# Patient Record
Sex: Female | Born: 1980 | Race: White | Hispanic: No | Marital: Single | Smoking: Never smoker
Health system: Southern US, Community
[De-identification: ages and names within clinical notes are randomized; demographics above are authoritative.]

## PROBLEM LIST (undated history)

## (undated) DIAGNOSIS — E282 Polycystic ovarian syndrome: Secondary | ICD-10-CM

## (undated) DIAGNOSIS — I10 Essential (primary) hypertension: Secondary | ICD-10-CM

## (undated) DIAGNOSIS — A6000 Herpesviral infection of urogenital system, unspecified: Secondary | ICD-10-CM

## (undated) HISTORY — PX: CHOLECYSTECTOMY: SHX55

---

## 2004-08-13 ENCOUNTER — Observation Stay: Payer: Self-pay | Admitting: Obstetrics and Gynecology

## 2004-10-07 ENCOUNTER — Emergency Department: Payer: Self-pay | Admitting: Emergency Medicine

## 2004-11-24 ENCOUNTER — Emergency Department: Payer: Self-pay | Admitting: Emergency Medicine

## 2005-04-03 ENCOUNTER — Emergency Department: Payer: Self-pay | Admitting: Emergency Medicine

## 2005-04-06 ENCOUNTER — Emergency Department: Payer: Self-pay | Admitting: Unknown Physician Specialty

## 2005-07-16 ENCOUNTER — Emergency Department: Payer: Self-pay | Admitting: Emergency Medicine

## 2006-02-14 ENCOUNTER — Emergency Department: Payer: Self-pay | Admitting: Emergency Medicine

## 2006-02-24 ENCOUNTER — Emergency Department: Payer: Self-pay | Admitting: Emergency Medicine

## 2007-01-30 ENCOUNTER — Emergency Department: Payer: Self-pay | Admitting: Emergency Medicine

## 2007-02-10 ENCOUNTER — Ambulatory Visit: Payer: Self-pay | Admitting: General Surgery

## 2009-09-19 ENCOUNTER — Emergency Department: Payer: Self-pay | Admitting: Emergency Medicine

## 2011-10-20 ENCOUNTER — Emergency Department: Payer: Self-pay | Admitting: *Deleted

## 2012-08-02 ENCOUNTER — Emergency Department: Payer: Self-pay | Admitting: Internal Medicine

## 2013-07-31 ENCOUNTER — Emergency Department: Payer: Self-pay | Admitting: Emergency Medicine

## 2013-07-31 LAB — URINALYSIS, COMPLETE
BACTERIA: NONE SEEN
BLOOD: NEGATIVE
Bilirubin,UR: NEGATIVE
Glucose,UR: NEGATIVE mg/dL (ref 0–75)
KETONE: NEGATIVE
Nitrite: NEGATIVE
PROTEIN: NEGATIVE
Ph: 6 (ref 4.5–8.0)
RBC,UR: 2 /HPF (ref 0–5)
SPECIFIC GRAVITY: 1.02 (ref 1.003–1.030)

## 2013-07-31 LAB — WET PREP, GENITAL

## 2013-07-31 LAB — GC/CHLAMYDIA PROBE AMP

## 2014-04-08 ENCOUNTER — Emergency Department: Payer: Self-pay | Admitting: Emergency Medicine

## 2014-07-29 ENCOUNTER — Emergency Department: Payer: Self-pay

## 2014-07-29 ENCOUNTER — Emergency Department
Admission: EM | Admit: 2014-07-29 | Discharge: 2014-07-29 | Disposition: A | Discharge status: Home / Self Care | Payer: Self-pay | Attending: Emergency Medicine | Admitting: Emergency Medicine

## 2014-07-29 DIAGNOSIS — I10 Essential (primary) hypertension: Secondary | ICD-10-CM | POA: Insufficient documentation

## 2014-07-29 DIAGNOSIS — R2240 Localized swelling, mass and lump, unspecified lower limb: Secondary | ICD-10-CM | POA: Insufficient documentation

## 2014-07-29 HISTORY — DX: Essential (primary) hypertension: I10

## 2014-07-29 LAB — COMPREHENSIVE METABOLIC PANEL
ALBUMIN: 3.8 g/dL (ref 3.5–5.0)
ALT: 16 U/L (ref 14–54)
AST: 21 U/L (ref 15–41)
Alkaline Phosphatase: 70 U/L (ref 38–126)
Anion gap: 7 (ref 5–15)
BILIRUBIN TOTAL: 0.3 mg/dL (ref 0.3–1.2)
BUN: 10 mg/dL (ref 6–20)
CHLORIDE: 105 mmol/L (ref 101–111)
CO2: 26 mmol/L (ref 22–32)
Calcium: 9 mg/dL (ref 8.9–10.3)
Creatinine, Ser: 0.78 mg/dL (ref 0.44–1.00)
GFR calc Af Amer: >60 mL/min (ref >60)
Glucose, Bld: 94 mg/dL (ref 65–99)
Potassium: 4.1 mmol/L (ref 3.5–5.1)
Sodium: 138 mmol/L (ref 135–145)
Total Protein: 7.9 g/dL (ref 6.5–8.1)

## 2014-07-29 LAB — CBC
HCT: 36.2 % (ref 35.0–47.0)
Hemoglobin: 11.5 g/dL — ABNORMAL LOW (ref 12.0–16.0)
MCH: 26.2 pg (ref 26.0–34.0)
MCHC: 31.7 g/dL — AB (ref 32.0–36.0)
MCV: 82.6 fL (ref 80.0–100.0)
Platelets: 294 10*3/uL (ref 150–440)
RBC: 4.39 MIL/uL (ref 3.80–5.20)
RDW: 18.6 % — ABNORMAL HIGH (ref 11.5–14.5)
WBC: 10.5 10*3/uL (ref 3.6–11.0)

## 2014-07-29 LAB — TROPONIN I: Troponin I: <0.03 ng/mL (ref <0.031)

## 2014-07-29 NOTE — ED Notes (Signed)
Pt reports swelling to bilateral ankles and feet since Thursday with high blood pressure. Denies SOB or Chest pain

## 2014-07-29 NOTE — ED Notes (Signed)
Pt reports leg swelling since Thursday.  Reports painful to walk.  Pt denies any long car rides.

## 2014-07-29 NOTE — Discharge Instructions (Signed)

## 2014-07-29 NOTE — ED Provider Notes (Addendum)
[facility] Emergency Department Provider Note    Time seen: 11 AM.  I have reviewed the triage vital signs and the nursing notes.   HISTORY  Chief Complaint Hypertension and Leg Swelling    HPI Becky Chen is a 34 y.o. female who presents to the ER with leg swelling since Thursday. States is painful for her to walk. Says she feels like she has extra fluid on her and that she has recently had high blood pressure. Denies any other complaints, or history of same      Past Medical History  Diagnosis Date  . Hypertension     There are no active problems to display for this patient.   Past Surgical History  Procedure Laterality Date  . Cholecystectomy    . Cesarean section      No current outpatient prescriptions on file.  Allergies Review of patient's allergies indicates no known allergies.  No family history on file.  Social History History  Substance Use Topics  . Smoking status: Never Smoker   . Smokeless tobacco: Not on file  . Alcohol Use: No   Family Review of Systems Constitutional: Negative for fever. Eyes: Negative for visual changes. ENT: Negative for sore throat. Cardiovascular: Negative for chest pain. Respiratory: Negative for shortness of breath. Gastrointestinal: Negative for abdominal pain, vomiting and diarrhea. Genitourinary: Negative for dysuria. Musculoskeletal: Leg swelling Skin: Negative for rash. Neurological: Negative for headaches, focal weakness or numbness.  10-point ROS otherwise negative.  ____________________________________________   PHYSICAL EXAM:  VITAL SIGNS: ED Triage Vitals  Enc Vitals Group     BP 07/29/14 1103 138/78 mmHg     Pulse Rate 07/29/14 1103 72     Resp 07/29/14 1103 20     Temp 07/29/14 1103 97.7 F (36.5 C)     Temp Source 07/29/14 1103 Oral     SpO2 07/29/14 1103 100 %     Weight 07/29/14 1109 278 lb (126.1 kg)     Height 07/29/14 1109 5\' 10"  (1.778 m)     Head Cir  --      Peak Flow --      Pain Score 07/29/14 1110 8     Pain Loc --      Pain Edu? --      Excl. in GC? --     Constitutional: Alert and oriented. Well appearing and in no distress. Eyes: Conjunctivae are normal. PERRL. Normal extraocular movements. ENT   Head: Normocephalic and atraumatic.   Nose: No congestion/rhinnorhea.   Mouth/Throat: Mucous membranes are moist.   Neck: No stridor. Hematological/Lymphatic/Immunilogical: No cervical lymphadenopathy. Cardiovascular: Normal rate, regular rhythm. Normal and symmetric distal pulses are present in all extremities. No murmurs, rubs, or gallops. Respiratory: Normal respiratory effort without tachypnea nor retractions. Breath sounds are clear and equal bilaterally. No wheezes/rales/rhonchi. Gastrointestinal: Soft and nontender. No distention. No abdominal bruits. There is no CVA tenderness. Musculoskeletal: Nontender with normal range of motion in all extremities. No joint effusions.  No lower extremity tenderness nor edema, legs appear symmetric Neurologic:  Normal speech and language. No gross focal neurologic deficits are appreciated. Speech is normal. No gait instability. Skin:  Skin is warm, dry and intact. No rash noted. Psychiatric: Mood and affect are normal. Speech and behavior are normal. Patient exhibits appropriate insight and judgment.  ____________________________________________    LABS (pertinent positives/negatives)  Labs Reviewed  CBC - Abnormal; Notable for the following:    Hemoglobin 11.5 (*)    MCHC 31.7 (*)  RDW 18.6 (*)    All other components within normal limits  TROPONIN I  COMPREHENSIVE METABOLIC PANEL    EKG: Normal sinus rhythm with incomplete by a right bundle branch block, rate is 74, no evidence of hypertrophy or acute infarction. EKG interpreted by me ____________________________________________    RADIOLOGY  No acute  process  ____________________________________________    ED COURSE  Pertinent labs & imaging results that were available during my care of the patient were reviewed by me and considered in my medical decision making (see chart for details).  Check basic labs and reevaluate. No indication for ultrasound this time.  FINAL ASSESSMENT AND PLAN  Assessment: Hypertension   Plan: No acute emergency medical condition. Patient Is Advised Increase Physical Activity and Follow up with Her Primary Care Doctor.    Emily FilbertWilliams, Zimal Weisensel E, MD   Emily FilbertJonathan E Oceana Walthall, MD 07/29/14 1244  Emily FilbertJonathan E Zinia Innocent, MD 07/29/14 [id number]  Emily FilbertJonathan E Frimet Durfee, MD 08/14/14 0730  Emily FilbertJonathan E Rogers Ditter, MD 08/14/14 [phone]

## 2014-10-15 ENCOUNTER — Emergency Department
Admission: EM | Admit: 2014-10-15 | Discharge: 2014-10-15 | Disposition: A | Discharge status: Home / Self Care | Payer: Self-pay | Attending: Emergency Medicine | Admitting: Emergency Medicine

## 2014-10-15 ENCOUNTER — Encounter: Payer: Self-pay | Admitting: Emergency Medicine

## 2014-10-15 DIAGNOSIS — A6000 Herpesviral infection of urogenital system, unspecified: Secondary | ICD-10-CM

## 2014-10-15 DIAGNOSIS — I10 Essential (primary) hypertension: Secondary | ICD-10-CM | POA: Insufficient documentation

## 2014-10-15 DIAGNOSIS — A6009 Herpesviral infection of other urogenital tract: Secondary | ICD-10-CM | POA: Insufficient documentation

## 2014-10-15 HISTORY — DX: Herpesviral infection of urogenital system, unspecified: A60.00

## 2014-10-15 MED ORDER — ACYCLOVIR 400 MG PO TABS: 400.0000 mg | ORAL_TABLET | Freq: Three times a day (TID) | ORAL | Status: DC

## 2014-10-15 NOTE — ED Provider Notes (Signed)
[facility] Emergency Department Provider Note ____________________________________________  Time seen: 1130  I have reviewed the triage vital signs and the nursing notes.  HISTORY  Chief Complaint  Vaginal Itching  HPI Becky Chen is a 34 y.o. female reports to the ED for evaluation management of a herpes outbreak. She describes that she was initially diagnosed in 2014 with her initial outbreak. She denies any outbreaks in the interim. She has noted over the last 2 days a lesions to her vulvar area. She describes them and blisters similar to her initial outbreak, just not as painful. She denies any fevers, chills, sweats. She is also without any vaginal discharge or abnormal bleeding. She rates her pain at a 3 out of 10 in triage.  Past Medical History  Diagnosis Date  . Hypertension   . Herpes genitalis    There are no active problems to display for this patient.  Past Surgical History  Procedure Laterality Date  . Cholecystectomy    . Cesarean section     Current Outpatient Rx  Name  Route  Sig  Dispense  Refill  . acyclovir (ZOVIRAX) 400 MG tablet   Oral   Take 1 tablet (400 mg total) by mouth 3 (three) times daily.   35 tablet   0    Allergies Review of patient's allergies indicates no known allergies.  History reviewed. No pertinent family history.  Social History History  Substance Use Topics  . Smoking status: Never Smoker   . Smokeless tobacco: Not on file  . Alcohol Use: No   Review of Systems  Constitutional: Negative for fever. Eyes: Negative for visual changes. ENT: Negative for sore throat. Cardiovascular: Negative for chest pain. Respiratory: Negative for shortness of breath. Gastrointestinal: Negative for abdominal pain, vomiting and diarrhea. Genitourinary: Negative for dysuria. Musculoskeletal: Negative for back pain. Skin: Negative for rash. Herpes outbreak Neurological: Negative for headaches, focal weakness or  numbness. ____________________________________________  PHYSICAL EXAM:  VITAL SIGNS: ED Triage Vitals  Enc Vitals Group     BP --      Pulse --      Resp --      Temp --      Temp src --      SpO2 --      Weight 10/15/14 1128 242 lb (109.77 kg)     Height 10/15/14 1128 5\' 2"  (1.575 m)     Head Cir --      Peak Flow --      Pain Score 10/15/14 1126 3     Pain Loc --      Pain Edu? --      Excl. in GC? --    Constitutional: Alert and oriented. Well appearing and in no distress. HEENT: Normocephalic and atraumatic. Conjunctivae are normal. PERRL. Normal extraocular movements. Respiratory: Normal respiratory effort.  Musculoskeletal: Nontender with normal range of motion in all extremities.  Neurologic:  Normal gait without ataxia. Normal speech and language. No gross focal neurologic deficits are appreciated. Skin:  Skin is warm, dry and intact. External labia with grouped vesicles consistent with recurrent herpes  Psychiatric: Mood and affect are normal. Patient exhibits appropriate insight and judgment. ____________________________________________  INITIAL IMPRESSION / ASSESSMENT AND PLAN / ED COURSE  Treatment for recurrent herpes genitalis outbreak. Acyclovir 400 mg TIDx 5 days.  Follow-up with primary provider as needed.  ____________________________________________  FINAL CLINICAL IMPRESSION(S) / ED DIAGNOSES  Final diagnoses:  Herpes genitalis     Becky Chen V Lonia Skinner,  PA-C 10/15/14 1159  Phineas Semen, MD 10/15/14 1231

## 2014-10-15 NOTE — Discharge Instructions (Signed)
Herpes Simplex Herpes simplex is generally classified as Type 1 or Type 2. Type 1 is generally the type that is responsible for cold sores. Type 2 is generally associated with sexually transmitted diseases. We now know that most of the thoughts on these viruses are inaccurate. We find that HSV1 is also present genitally and HSV2 can be present orally, but this will vary in different locations of the world. Herpes simplex is usually detected by doing a culture. Blood tests are also available for this virus; however, the accuracy is often not as good.  PREPARATION FOR TEST No preparation or fasting is necessary. NORMAL FINDINGS  No virus present  No HSV antigens or antibodies present Ranges for normal findings may vary among different laboratories and hospitals. You should always check with your doctor after having lab work or other tests done to discuss the meaning of your test results and whether your values are considered within normal limits. MEANING OF TEST  Your caregiver will go over the test results with you and discuss the importance and meaning of your results, as well as treatment options and the need for additional tests if necessary. OBTAINING THE TEST RESULTS  It is your responsibility to obtain your test results. Ask the lab or department performing the test when and how you will get your results. Document Released: 04/10/2004 Document Revised: 05/31/2011 Document Reviewed: 02/17/2008 [facility] Patient Information 2015 [place], [place]. This information is not intended to replace advice given to you by your health care provider. Make sure you discuss any questions you have with your health care provider.  Take the antiviral as directed. Follow-up with your provider as needed.

## 2014-10-15 NOTE — ED Notes (Signed)
States she developed a herpes outbreak about 2 days ago

## 2015-09-06 ENCOUNTER — Encounter: Payer: Self-pay | Admitting: Emergency Medicine

## 2015-09-06 ENCOUNTER — Emergency Department: Payer: Medicaid Other

## 2015-09-06 ENCOUNTER — Emergency Department
Admission: EM | Admit: 2015-09-06 | Discharge: 2015-09-06 | Disposition: A | Discharge status: Home / Self Care | Payer: Medicaid Other | Attending: Emergency Medicine | Admitting: Emergency Medicine

## 2015-09-06 DIAGNOSIS — S300XXA Contusion of lower back and pelvis, initial encounter: Secondary | ICD-10-CM

## 2015-09-06 DIAGNOSIS — Z79899 Other long term (current) drug therapy: Secondary | ICD-10-CM | POA: Insufficient documentation

## 2015-09-06 DIAGNOSIS — Y929 Unspecified place or not applicable: Secondary | ICD-10-CM | POA: Insufficient documentation

## 2015-09-06 DIAGNOSIS — Y999 Unspecified external cause status: Secondary | ICD-10-CM | POA: Diagnosis not present

## 2015-09-06 DIAGNOSIS — I10 Essential (primary) hypertension: Secondary | ICD-10-CM | POA: Diagnosis not present

## 2015-09-06 DIAGNOSIS — M549 Dorsalgia, unspecified: Secondary | ICD-10-CM | POA: Diagnosis present

## 2015-09-06 DIAGNOSIS — W07XXXA Fall from chair, initial encounter: Secondary | ICD-10-CM | POA: Diagnosis not present

## 2015-09-06 DIAGNOSIS — Y9389 Activity, other specified: Secondary | ICD-10-CM | POA: Insufficient documentation

## 2015-09-06 LAB — URINALYSIS COMPLETE WITH MICROSCOPIC (ARMC ONLY)
Bilirubin Urine: NEGATIVE
GLUCOSE, UA: NEGATIVE mg/dL
Ketones, ur: NEGATIVE mg/dL
Leukocytes, UA: NEGATIVE
NITRITE: NEGATIVE
Protein, ur: 30 mg/dL — AB
SPECIFIC GRAVITY, URINE: 1.028 (ref 1.005–1.030)
pH: 5 (ref 5.0–8.0)

## 2015-09-06 LAB — POCT PREGNANCY, URINE: Preg Test, Ur: NEGATIVE

## 2015-09-06 MED ORDER — IBUPROFEN 600 MG PO TABS: 600.0000 mg | ORAL_TABLET | Freq: Three times a day (TID) | ORAL | Status: DC | PRN

## 2015-09-06 MED ORDER — IBUPROFEN 600 MG PO TABS
600.0000 mg | ORAL_TABLET | Freq: Once | ORAL | Status: AC
  Administered 2015-09-06: 600 mg via ORAL
  Filled 2015-09-06: qty 1

## 2015-09-06 NOTE — Discharge Instructions (Signed)
Contusion A contusion is a deep bruise. Contusions happen when an injury causes bleeding under the skin. Symptoms of bruising include pain, swelling, and discolored skin. The skin may turn blue, purple, or yellow. HOME CARE   Rest the injured area.  If told, put ice on the injured area.  Put ice in a plastic bag.  Place a towel between your skin and the bag.  Leave the ice on for 20 minutes, 2-3 times per day.  If told, put light pressure (compression) on the injured area using an elastic bandage. Make sure the bandage is not too tight. Remove it and put it back on as told by your doctor.  If possible, raise (elevate) the injured area above the level of your heart while you are sitting or lying down.  Take over-the-counter and prescription medicines only as told by your doctor. GET HELP IF:  Your symptoms do not get better after several days of treatment.  Your symptoms get worse.  You have trouble moving the injured area. GET HELP RIGHT AWAY IF:   You have very bad pain.  You have a loss of feeling (numbness) in a hand or foot.  Your hand or foot turns pale or cold.   This information is not intended to replace advice given to you by your health care provider. Make sure you discuss any questions you have with your health care provider.   Document Released: 08/25/2007 Document Revised: 11/27/2014 Document Reviewed: 07/24/2014 Elsevier Interactive Patient Education [employer]    Follow-up with your primary care doctor this week if any continued problems. Ice to your back as needed for pain and inflammation. Begin taking ibuprofen every 8 hours with food.

## 2015-09-06 NOTE — ED Notes (Signed)
Fell off chair landing on back 2 days ago, no LOC.

## 2015-09-06 NOTE — ED Provider Notes (Signed)
[facility] Emergency Department Provider Note   ____________________________________________  Time seen: Approximately 10:03 AM  I have reviewed the triage vital signs and the nursing notes.   HISTORY  Chief Complaint Back Pain   HPI Becky Chen is a 35 y.o. female is here with complaint of back pain. Patient states she was standing on a chair when she fell backwards landing on her back 2 days ago. Patient did not hit her head and she denies any loss of consciousness. She has not taken any over-the-counter medication but continues to be in pain. She states she has continued to be ambulatory since her accident. She denies any hematuria.Currently she rates her pain as 9 out of 10. Patient is also currently having her normal menses.     Past Medical History  Diagnosis Date  . Hypertension   . Herpes genitalis     There are no active problems to display for this patient.   Past Surgical History  Procedure Laterality Date  . Cholecystectomy    . Cesarean section      Current Outpatient Rx  Name  Route  Sig  Dispense  Refill  . acyclovir (ZOVIRAX) 400 MG tablet   Oral   Take 1 tablet (400 mg total) by mouth 3 (three) times daily.   35 tablet   0   . ibuprofen (ADVIL,MOTRIN) 600 MG tablet   Oral   Take 1 tablet (600 mg total) by mouth every 8 (eight) hours as needed.   30 tablet   0     Allergies Review of patient's allergies indicates no known allergies.  No family history on file.  Social History Social History  Substance Use Topics  . Smoking status: Never Smoker   . Smokeless tobacco: None  . Alcohol Use: No    Review of Systems Constitutional: No fever/chills Eyes: No visual changes. ENT: No trauma Cardiovascular: Denies chest pain. Respiratory: Denies shortness of breath. Gastrointestinal: No abdominal pain.  No nausea, no vomiting.   Genitourinary: Negative for dysuria.  Negative for hematuria Musculoskeletal:  Positive for back pain. Skin: Negative for rash. Negative for ecchymosis. Neurological: Negative for headaches, focal weakness or numbness.  10-point ROS otherwise negative.  ____________________________________________   PHYSICAL EXAM:  VITAL SIGNS: ED Triage Vitals  Enc Vitals Group     BP 09/06/15 0954 119/79 mmHg     Pulse Rate 09/06/15 0954 88     Resp 09/06/15 0954 18     Temp 09/06/15 0954 98.3 F (36.8 C)     Temp src --      SpO2 09/06/15 0954 100 %     Weight 09/06/15 0954 250 lb (113.399 kg)     Height 09/06/15 0954 5\' 1"  (1.549 m)     Head Cir --      Peak Flow --      Pain Score 09/06/15 0956 9     Pain Loc --      Pain Edu? --      Excl. in GC? --     Constitutional: Alert and oriented. Well appearing and in no acute distress.Moderate obesity. Eyes: Conjunctivae are normal. PERRL. EOMI. Head: Atraumatic. Nose: No congestion/rhinnorhea. Neck: No stridor.  No cervical tenderness on palpation posteriorly. Range of motion is without restriction or pain. Cardiovascular: Normal rate, regular rhythm. Grossly normal heart sounds.  Good peripheral circulation. Respiratory: Normal respiratory effort.  No retractions. Lungs CTAB. Gastrointestinal: Soft and nontender. No distention. Bowel sounds are normoactive 4 quadrants.  Musculoskeletal: Moves upper and lower extremities Without any difficulty. Normal gait was noted. There is moderate tenderness on palpation of the lumbar spine and paravertebral muscles without any active muscle spasms noted. No ecchymosis or abrasions were seen. Neurologic:  Normal speech and language. No gross focal neurologic deficits are appreciated. No gait instability. Skin:  Skin is warm, dry and intact. As above. Psychiatric: Mood and affect are normal. Speech and behavior are normal.  ____________________________________________   LABS (all labs ordered are listed, but only abnormal results are displayed)  Labs Reviewed  URINALYSIS  COMPLETEWITH MICROSCOPIC ([facility] ONLY) - Abnormal; Notable for the following:    Color, Urine YELLOW (*)    APPearance HAZY (*)    Hgb urine dipstick 3+ (*)    Protein, ur 30 (*)    Bacteria, UA RARE (*)    Squamous Epithelial / LPF 0-5 (*)    All other components within normal limits  POC URINE PREG, ED  POCT PREGNANCY, URINE    RADIOLOGY X-ray lumbar spine per radiologist shows no spinal fracture or spondylolisthesis. I, Tommi Rumps, personally viewed and evaluated these images (plain radiographs) as part of my medical decision making, as well as reviewing the written report by the radiologist. ____________________________________________   PROCEDURES  Procedure(s) performed: None  Critical Care performed: No  ____________________________________________   INITIAL IMPRESSION / ASSESSMENT AND PLAN / ED COURSE  Pertinent labs & imaging results that were available during my care of the patient were reviewed by me and considered in my medical decision making (see chart for details).  Patient was given ibuprofen prior to going to x-ray. She voices some improvement of her pain. Patient was discharged on ibuprofen 600 mg with food 3 times a day and also encouraged to use ice to her back as needed. [facility] if any continued problems with her back. ____________________________________________   FINAL CLINICAL IMPRESSION(S) / ED DIAGNOSES  Final diagnoses:  Lumbar contusion, initial encounter      NEW MEDICATIONS STARTED DURING THIS VISIT:  Discharge Medication List as of 09/06/2015 11:50 AM    START taking these medications   Details  ibuprofen (ADVIL,MOTRIN) 600 MG tablet Take 1 tablet (600 mg total) by mouth every 8 (eight) hours as needed., Starting 09/06/2015, Until Discontinued, Print         Note:  This document was prepared using Dragon voice recognition software and may include unintentional dictation  errors.    Tommi Rumps, PA-C 09/06/15 1241  Jennye Moccasin, MD 09/06/15 [phone]

## 2018-10-22 ENCOUNTER — Emergency Department
Admission: EM | Admit: 2018-10-22 | Discharge: 2018-10-22 | Disposition: A | Discharge status: Home / Self Care | Payer: Medicaid Other | Attending: Emergency Medicine | Admitting: Emergency Medicine

## 2018-10-22 ENCOUNTER — Emergency Department: Payer: Medicaid Other

## 2018-10-22 ENCOUNTER — Other Ambulatory Visit: Payer: Self-pay

## 2018-10-22 ENCOUNTER — Encounter: Payer: Self-pay | Admitting: Emergency Medicine

## 2018-10-22 DIAGNOSIS — R600 Localized edema: Secondary | ICD-10-CM | POA: Insufficient documentation

## 2018-10-22 DIAGNOSIS — I10 Essential (primary) hypertension: Secondary | ICD-10-CM | POA: Insufficient documentation

## 2018-10-22 DIAGNOSIS — M7989 Other specified soft tissue disorders: Secondary | ICD-10-CM

## 2018-10-22 DIAGNOSIS — R609 Edema, unspecified: Secondary | ICD-10-CM

## 2018-10-22 LAB — COMPREHENSIVE METABOLIC PANEL
ALT: 17 U/L (ref 0–44)
AST: 17 U/L (ref 15–41)
Albumin: 3.3 g/dL — ABNORMAL LOW (ref 3.5–5.0)
Alkaline Phosphatase: 87 U/L (ref 38–126)
Anion gap: 8 (ref 5–15)
BUN: 8 mg/dL (ref 6–20)
CO2: 22 mmol/L (ref 22–32)
Calcium: 8.7 mg/dL — ABNORMAL LOW (ref 8.9–10.3)
Chloride: 107 mmol/L (ref 98–111)
Creatinine, Ser: 0.63 mg/dL (ref 0.44–1.00)
GFR calc Af Amer: >60 mL/min (ref >60)
GFR calc non Af Amer: >60 mL/min (ref >60)
Glucose, Bld: 106 mg/dL — ABNORMAL HIGH (ref 70–99)
Potassium: 3.8 mmol/L (ref 3.5–5.1)
Sodium: 137 mmol/L (ref 135–145)
Total Bilirubin: 0.6 mg/dL (ref 0.3–1.2)
Total Protein: 7.6 g/dL (ref 6.5–8.1)

## 2018-10-22 LAB — CBC
HCT: 36.7 % (ref 36.0–46.0)
Hemoglobin: 10.9 g/dL — ABNORMAL LOW (ref 12.0–15.0)
MCH: 23.2 pg — ABNORMAL LOW (ref 26.0–34.0)
MCHC: 29.7 g/dL — ABNORMAL LOW (ref 30.0–36.0)
MCV: 78.3 fL — ABNORMAL LOW (ref 80.0–100.0)
Platelets: 410 10*3/uL — ABNORMAL HIGH (ref 150–400)
RBC: 4.69 MIL/uL (ref 3.87–5.11)
RDW: 20.2 % — ABNORMAL HIGH (ref 11.5–15.5)
WBC: 10.9 10*3/uL — ABNORMAL HIGH (ref 4.0–10.5)
nRBC: 0 % (ref 0.0–0.2)

## 2018-10-22 LAB — BRAIN NATRIURETIC PEPTIDE: B Natriuretic Peptide: 29 pg/mL (ref 0.0–100.0)

## 2018-10-22 MED ORDER — IBUPROFEN 600 MG PO TABS: 600.0000 mg | ORAL_TABLET | Freq: Three times a day (TID) | ORAL | 0 refills | Status: DC | PRN

## 2018-10-22 NOTE — Discharge Instructions (Addendum)
As we discussed, elevate your ankles whenever you are not standing.  Try to minimize the amount of time spent standing for more than 1 hour.  Elevate your ankles above the level of your heart.  Take the ibuprofen as needed.  I recommend trying to wear compression socks or at least tight fitting higher socks, to help with the swelling.  Your lab work today was otherwise reassuring with no signs of heart failure, kidney failure, or liver failure.

## 2018-10-22 NOTE — ED Triage Notes (Signed)
Pt to ED with c/o of left leg swelling and bilateral swelling of ankles.

## 2018-10-22 NOTE — ED Notes (Signed)
Urine Pregnancy Negative

## 2018-10-22 NOTE — ED Provider Notes (Signed)
[facility] Emergency Department Provider Note  ____________________________________________   First MD Initiated Contact with Patient 10/22/18 1958     (approximate)  I have reviewed the triage vital signs and the nursing notes.   HISTORY  Chief Complaint Leg Swelling    HPI Becky Chen is a 38 y.o. female  Here with bilateral ankle pain and swelling. Pt states that over the past several weeks, she's had progressively worsening bilateral ankle pain and swelling. It is worse at night but doe snot necessarily resolve in the mornings. Over the past 2-3 days, she has now noticed mild left calf pain. 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 Does not wear supportive socks. She does state she has a family h/o heart failure so is concerned about this. She's had mild SOB with exertion as well but no CP. NO other complaints. She is not on OCPs, denies personal or family h/o DVT. Pain is mild, aching, worse with weightbearing. Denies any falls or injuries to the area.       Past Medical History:  Diagnosis Date  . Herpes genitalis   . Hypertension     There are no active problems to display for this patient.   Past Surgical History:  Procedure Laterality Date  . CESAREAN SECTION    . CHOLECYSTECTOMY      Prior to Admission medications   Medication Sig Start Date End Date Taking? Authorizing Provider  acyclovir (ZOVIRAX) 400 MG tablet Take 1 tablet (400 mg total) by mouth 3 (three) times daily. 10/15/14   Menshew, Charlesetta IvoryJenise V Bacon, PA-C  ibuprofen (ADVIL) 600 MG tablet Take 1 tablet (600 mg total) by mouth every 8 (eight) hours as needed (ankle pain). 10/22/18   Shaune PollackIsaacs, Eric Morganti, MD    Allergies Patient has no known allergies.  History reviewed. No pertinent family history.  Social History Social History   Tobacco Use  . Smoking status: Never Smoker  . Smokeless tobacco: Never Used  Substance Use Topics  . Alcohol  use: No  . Drug use: No    Review of Systems  Review of Systems  Constitutional: Negative for fatigue and fever.  HENT: Negative for congestion and sore throat.   Eyes: Negative for visual disturbance.  Respiratory: Negative for cough and shortness of breath.   Cardiovascular: Positive for leg swelling. Negative for chest pain.  Gastrointestinal: Negative for abdominal pain, diarrhea, nausea and vomiting.  Genitourinary: Negative for flank pain.  Musculoskeletal: Positive for arthralgias and myalgias. Negative for back pain and neck pain.  Skin: Negative for rash and wound.  Neurological: Negative for weakness.  All other systems reviewed and are negative.    ____________________________________________  PHYSICAL EXAM:      VITAL SIGNS: ED Triage Vitals  Enc Vitals Group     BP 10/22/18 1654 (!) 135/102     Pulse Rate 10/22/18 1654 94     Resp 10/22/18 1654 16     Temp 10/22/18 1654 98.4 F (36.9 C)     Temp Source 10/22/18 1654 Oral     SpO2 10/22/18 1654 98 %     Weight 10/22/18 1659 289 lb (131.1 kg)     Height 10/22/18 1659 5\' 1"  (1.549 m)     Head Circumference --      Peak Flow --      Pain Score 10/22/18 1659 8     Pain Loc --      Pain Edu? --  Excl. in [place]? --      Physical Exam Vitals signs and nursing note reviewed.  Constitutional:      General: She is not in acute distress.    Appearance: She is well-developed.  HENT:     Head: Normocephalic and atraumatic.  Eyes:     Conjunctiva/sclera: Conjunctivae normal.  Neck:     Musculoskeletal: Neck supple.  Cardiovascular:     Rate and Rhythm: Normal rate and regular rhythm.     Heart sounds: Normal heart sounds. No murmur. No friction rub.  Pulmonary:     Effort: Pulmonary effort is normal. No respiratory distress.     Breath sounds: Normal breath sounds. No wheezing or rales.  Abdominal:     General: There is no distension.     Palpations: Abdomen is soft.     Tenderness: There is no abdominal  tenderness.  Musculoskeletal:     Right lower leg: Edema (trace, non pitting) present.     Left lower leg: Edema (trace, non pitting) present.  Skin:    General: Skin is warm.     Capillary Refill: Capillary refill takes less than 2 seconds.  Neurological:     Mental Status: She is alert and oriented to person, place, and time.     Motor: No abnormal muscle tone.       ____________________________________________   LABS (all labs ordered are listed, but only abnormal results are displayed)  Labs Reviewed  COMPREHENSIVE METABOLIC PANEL - Abnormal; Notable for the following components:      Result Value   Glucose, Bld 106 (*)    Calcium 8.7 (*)    Albumin 3.3 (*)    All other components within normal limits  CBC - Abnormal; Notable for the following components:   WBC 10.9 (*)    Hemoglobin 10.9 (*)    MCV 78.3 (*)    MCH 23.2 (*)    MCHC 29.7 (*)    RDW 20.2 (*)    Platelets 410 (*)    All other components within normal limits  BRAIN NATRIURETIC PEPTIDE  POC URINE PREG, ED    ____________________________________________  EKG: None ________________________________________  RADIOLOGY All imaging, including plain films, CT scans, and ultrasounds, independently reviewed by me, and interpretations confirmed via formal radiology reads.  ED MD interpretation:   CXR: Clear U/S: Negative  Official radiology report(s): Dg Chest 2 View  Result Date: 10/22/2018 CLINICAL DATA:  Shortness of breath and bilateral lower extremity swelling EXAM: CHEST - 2 VIEW COMPARISON:  07/29/2014 FINDINGS: The heart size and mediastinal contours are within normal limits. Both lungs are clear. The visualized skeletal structures are unremarkable. IMPRESSION: No active cardiopulmonary disease. Electronically Signed   By: Inez Catalina M.D.   On: 10/22/2018 20:31   US Venous Img Lower Bilateral  Result Date: 10/22/2018 CLINICAL DATA:  Bilateral lower extremity edema. EXAM: BILATERAL LOWER  EXTREMITY VENOUS DOPPLER ULTRASOUND TECHNIQUE: Gray-scale sonography with graded compression, as well as color Doppler and duplex ultrasound were performed to evaluate the lower extremity deep venous systems from the level of the common femoral vein and including the common femoral, femoral, profunda femoral, popliteal and calf veins including the posterior tibial, peroneal and gastrocnemius veins when visible. The superficial great saphenous vein was also interrogated. Spectral Doppler was utilized to evaluate flow at rest and with distal augmentation maneuvers in the common femoral, femoral and popliteal veins. COMPARISON:  None FINDINGS: RIGHT LOWER EXTREMITY Common Femoral Vein: No evidence of thrombus. Normal compressibility,  respiratory phasicity and response to augmentation. Saphenofemoral Junction: No evidence of thrombus. Normal compressibility and flow on color Doppler imaging. Profunda Femoral Vein: No evidence of thrombus. Normal compressibility and flow on color Doppler imaging. Femoral Vein: No evidence of thrombus. Normal compressibility, respiratory phasicity and response to augmentation. Popliteal Vein: No evidence of thrombus. Normal compressibility, respiratory phasicity and response to augmentation. Calf Veins: No evidence of thrombus. Normal compressibility and flow on color Doppler imaging. Superficial Great Saphenous Vein: No evidence of thrombus. Normal compressibility. Venous Reflux:  None. Other Findings:  None. LEFT LOWER EXTREMITY Common Femoral Vein: No evidence of thrombus. Normal compressibility, respiratory phasicity and response to augmentation. Saphenofemoral Junction: No evidence of thrombus. Normal compressibility and flow on color Doppler imaging. Profunda Femoral Vein: No evidence of thrombus. Normal compressibility and flow on color Doppler imaging. Femoral Vein: No evidence of thrombus. Normal compressibility, respiratory phasicity and response to augmentation. Popliteal Vein:  No evidence of thrombus. Normal compressibility, respiratory phasicity and response to augmentation. Calf Veins: No evidence of thrombus. Normal compressibility and flow on color Doppler imaging. Superficial Great Saphenous Vein: No evidence of thrombus. Normal compressibility. Venous Reflux:  None. Other Findings:  None. IMPRESSION: No evidence of deep venous thrombosis in either lower extremity. Electronically Signed   By: Signa Kellaylor  Stroud M.D.   On: 10/22/2018 21:03    ____________________________________________  PROCEDURES   Procedure(s) performed (including Critical Care):  Procedures  ____________________________________________  INITIAL IMPRESSION / MDM / ASSESSMENT AND PLAN / ED COURSE  As part of my medical decision making, I reviewed the following data within the electronic MEDICAL RECORD NUMBER Notes from prior ED visits and [place] Controlled Substance Database      *Becky Chen was evaluated in Emergency Department on 10/23/2018 for the symptoms described in the history of present illness. She was evaluated in the context of the global COVID-19 pandemic, which necessitated consideration that the patient might be at risk for infection with the SARS-CoV-2 virus that causes COVID-19. Institutional protocols and algorithms that pertain to the evaluation of patients at risk for COVID-19 are in a state of rapid change based on information released by regulatory bodies including the CDC and federal and state organizations. These policies and algorithms were followed during the patient's care in the ED.  Some ED evaluations and interventions may be delayed as a result of limited staffing during the pandemic.*      Medical Decision Making: 38 yo F with no significant PMHx here with bilateral ankle pain. Suspect dependent edema, likely with component of vascular insufficiency. No evidence of CHF, renal failure, or liver failure. U/S neg. D/c with compression stockings, instructions to elevate  ankles. ____________________________________________  FINAL CLINICAL IMPRESSION(S) / ED DIAGNOSES  Final diagnoses:  Dependent edema     MEDICATIONS GIVEN DURING THIS VISIT:  Medications - No data to display   ED Discharge Orders         Ordered    ibuprofen (ADVIL) 600 MG tablet  Every 8 hours PRN     10/22/18 2150           Note:  This document was prepared using Dragon voice recognition software and may include unintentional dictation errors.   Shaune PollackIsaacs, Thomasenia Dowse, MD 10/23/18 [phone]

## 2018-12-08 ENCOUNTER — Other Ambulatory Visit: Payer: Self-pay

## 2018-12-08 ENCOUNTER — Emergency Department: Payer: Medicaid Other

## 2018-12-08 ENCOUNTER — Emergency Department
Admission: EM | Admit: 2018-12-08 | Discharge: 2018-12-08 | Disposition: A | Discharge status: Home / Self Care | Payer: Medicaid Other | Attending: Student in an Organized Health Care Education/Training Program | Admitting: Student in an Organized Health Care Education/Training Program

## 2018-12-08 DIAGNOSIS — M25571 Pain in right ankle and joints of right foot: Secondary | ICD-10-CM | POA: Diagnosis not present

## 2018-12-08 DIAGNOSIS — I10 Essential (primary) hypertension: Secondary | ICD-10-CM | POA: Insufficient documentation

## 2018-12-08 DIAGNOSIS — G8929 Other chronic pain: Secondary | ICD-10-CM | POA: Insufficient documentation

## 2018-12-08 DIAGNOSIS — M25572 Pain in left ankle and joints of left foot: Secondary | ICD-10-CM | POA: Diagnosis not present

## 2018-12-08 DIAGNOSIS — Z3202 Encounter for pregnancy test, result negative: Secondary | ICD-10-CM | POA: Insufficient documentation

## 2018-12-08 LAB — HCG, QUANTITATIVE, PREGNANCY: hCG, Beta Chain, Quant, S: <1 m[IU]/mL (ref <5)

## 2018-12-08 NOTE — ED Provider Notes (Signed)
[facility] Emergency Department Provider Note  ____________________________________________  Time seen: Approximately 3:19 PM  I have reviewed the triage vital signs and the nursing notes.   HISTORY  Chief Complaint Ankle Pain and Possible Pregnancy    HPI Becky Chen is a 38 y.o. female that presents to the emergency department today requesting a pregnancy test.  Patient states that she is 1 week late on her menstrual cycle.  Patient had her tubes tied. No nausea, vomiting, abdominal pain.  Patient states that she was also evaluated in this emergency department just over 1 month ago for bilateral lower extremity swelling.  Patient states that swelling never resolved. Left ankle pain is worse than the right.  She is using her compression stockings.  Patient states the swelling has been present for 1 year.  Swelling and pain have not changed in character today.  Patient came to the emergency department today since her mother was also coming here.  She never followed up with primary care.    Past Medical History:  Diagnosis Date  . Herpes genitalis   . Hypertension     There are no active problems to display for this patient.   Past Surgical History:  Procedure Laterality Date  . CESAREAN SECTION    . CHOLECYSTECTOMY      Prior to Admission medications   Medication Sig Start Date End Date Taking? Authorizing Provider  acyclovir (ZOVIRAX) 400 MG tablet Take 1 tablet (400 mg total) by mouth 3 (three) times daily. Patient not taking: Reported on 12/08/2018 10/15/14   Menshew, Charlesetta IvoryJenise V Bacon, PA-C    Allergies Patient has no known allergies.  No family history on file.  Social History Social History   Tobacco Use  . Smoking status: Never Smoker  . Smokeless tobacco: Never Used  Substance Use Topics  . Alcohol use: No  . Drug use: No     Review of Systems  Respiratory: No SOB. Gastrointestinal: No abdominal pain.  No nausea, no vomiting.   Musculoskeletal: Positive for bilateral ankle pain. Skin: Negative for rash, abrasions, lacerations, ecchymosis. Neurological: Negative for headaches, numbness or tingling   ____________________________________________   PHYSICAL EXAM:  VITAL SIGNS: ED Triage Vitals  Enc Vitals Group     BP 12/08/18 1246 (!) 143/74     Pulse Rate 12/08/18 1246 100     Resp 12/08/18 1246 18     Temp 12/08/18 1246 97.9 F (36.6 C)     Temp Source 12/08/18 1246 Oral     SpO2 12/08/18 1246 95 %     Weight 12/08/18 1247 289 lb (131.1 kg)     Height 12/08/18 1247 5\' 1"  (1.549 m)     Head Circumference --      Peak Flow --      Pain Score 12/08/18 1247 9     Pain Loc --      Pain Edu? --      Excl. in [place]? --      Constitutional: Alert and oriented. Well appearing and in no acute distress. Eyes: Conjunctivae are normal. PERRL. EOMI. Head: Atraumatic. ENT:      Ears:      Nose: No congestion/rhinnorhea.      Mouth/Throat: Mucous membranes are moist.  Neck: No stridor. Cardiovascular: Normal rate, regular rhythm.  Good peripheral circulation. Respiratory: Normal respiratory effort without tachypnea or retractions. Lungs CTAB. Good air entry to the bases with no decreased or absent breath sounds. Gastrointestinal: Bowel sounds 4 quadrants. Soft  and nontender to palpation. No guarding or rigidity. No palpable masses. No distention. Musculoskeletal: Full range of motion to all extremities. No gross deformities appreciated. Trace, non pitting edema present to both ankles. Neurologic:  Normal speech and language. No gross focal neurologic deficits are appreciated.  Skin:  Skin is warm, dry and intact. No rash noted. Psychiatric: Mood and affect are normal. Speech and behavior are normal. Patient exhibits appropriate insight and judgement.   ____________________________________________   LABS (all labs ordered are listed, but only abnormal results are displayed)  Labs Reviewed  HCG,  QUANTITATIVE, PREGNANCY  POC URINE PREG, ED   ____________________________________________  EKG   ____________________________________________  RADIOLOGY Robinette Haines, personally viewed and evaluated these images (plain radiographs) as part of my medical decision making, as well as reviewing the written report by the radiologist.  Dg Ankle Complete Left  Result Date: 12/08/2018 CLINICAL DATA:  Pain and swelling EXAM: LEFT ANKLE COMPLETE - 3+ VIEW COMPARISON:  None. FINDINGS: Frontal, oblique, and lateral views were obtained. There is mild generalized soft tissue swelling. There is no evident fracture or joint effusion. There is no joint space narrowing or erosion. Ankle mortise appears intact. There is a posterior calcaneal spur. IMPRESSION: Soft tissue swelling. No evident fracture. No appreciable arthropathy. Ankle mortise appears intact. There is a posterior calcaneal spur. Electronically Signed   By: Lowella Grip III M.D.   On: 12/08/2018 14:54    ____________________________________________    PROCEDURES  Procedure(s) performed:    Procedures    Medications - No data to display   ____________________________________________   INITIAL IMPRESSION / ASSESSMENT AND PLAN / ED COURSE  Pertinent labs & imaging results that were available during my care of the patient were reviewed by me and considered in my medical decision making (see chart for details).  Review of the [place] CSRS was performed in accordance of the [place] prior to dispensing any controlled drugs.   Patient presented emergency department today requesting a pregnancy test.  Vital signs and exam are reassuring.  Beta-hCG is negative.  Patient also has bilateral ankle swelling for 1 year and has not changed in character today.  Patient was evaluated for this about 1 month ago in this emergency department with labs and negative bilateral lower extremity ultrasounds. Symptoms have not worsened or changed. She  did not follow-up after emergency department visit. Patient is to follow up with primary care as directed. She is agreeable. Patient is given ED precautions to return to the ED for any worsening or new symptoms.   Chanay Nugent was evaluated in Emergency Department on 12/08/2018 for the symptoms described in the history of present illness. She was evaluated in the context of the global COVID-19 pandemic, which necessitated consideration that the patient might be at risk for infection with the SARS-CoV-2 virus that causes COVID-19. Institutional protocols and algorithms that pertain to the evaluation of patients at risk for COVID-19 are in a state of rapid change based on information released by regulatory bodies including the CDC and federal and state organizations. These policies and algorithms were followed during the patient's care in the ED.  ____________________________________________  FINAL CLINICAL IMPRESSION(S) / ED DIAGNOSES  Final diagnoses:  Chronic pain of both ankles  Negative pregnancy test      NEW MEDICATIONS STARTED DURING THIS VISIT:  ED Discharge Orders    None          This chart was dictated using voice recognition software/Dragon. Despite best efforts to proofread,  errors can occur which can change the meaning. Any change was purely unintentional.    Enid Derry, PA-C 12/08/18 1835    Willy Eddy, MD 12/11/18 1005

## 2018-12-08 NOTE — ED Triage Notes (Signed)
Pt c/o left ankle pain for several months, unsure of injury. Pt also request a pregnancy test states she is about a week late.

## 2018-12-08 NOTE — ED Notes (Signed)
See triage note  Presents with swelling and pain to left foot  States pain is moving into lower leg  Denies any injury   States has been seen with swelling to foot in past  Ambulates well

## 2018-12-08 NOTE — Discharge Instructions (Signed)
Please make an appointment with Becky Chen for your ankle swelling.  Your work-up last month in the emergency department for your ankle swelling is reassuring.  Your x-ray does not show any fracture.  Your pregnancy test was negative.

## 2019-04-11 ENCOUNTER — Emergency Department
Admission: EM | Admit: 2019-04-11 | Discharge: 2019-04-11 | Disposition: A | Discharge status: Home / Self Care | Payer: Medicaid Other | Attending: Emergency Medicine | Admitting: Emergency Medicine

## 2019-04-11 ENCOUNTER — Other Ambulatory Visit: Payer: Self-pay

## 2019-04-11 ENCOUNTER — Emergency Department: Payer: Medicaid Other

## 2019-04-11 ENCOUNTER — Encounter: Payer: Self-pay | Admitting: Medical Oncology

## 2019-04-11 DIAGNOSIS — N939 Abnormal uterine and vaginal bleeding, unspecified: Secondary | ICD-10-CM

## 2019-04-11 DIAGNOSIS — I10 Essential (primary) hypertension: Secondary | ICD-10-CM | POA: Diagnosis not present

## 2019-04-11 DIAGNOSIS — R109 Unspecified abdominal pain: Secondary | ICD-10-CM

## 2019-04-11 DIAGNOSIS — N938 Other specified abnormal uterine and vaginal bleeding: Secondary | ICD-10-CM | POA: Insufficient documentation

## 2019-04-11 LAB — CBC
HCT: 39 % (ref 36.0–46.0)
Hemoglobin: 11.6 g/dL — ABNORMAL LOW (ref 12.0–15.0)
MCH: 24.4 pg — ABNORMAL LOW (ref 26.0–34.0)
MCHC: 29.7 g/dL — ABNORMAL LOW (ref 30.0–36.0)
MCV: 81.9 fL (ref 80.0–100.0)
Platelets: 392 10*3/uL (ref 150–400)
RBC: 4.76 MIL/uL (ref 3.87–5.11)
RDW: 18.1 % — ABNORMAL HIGH (ref 11.5–15.5)
WBC: 9.5 10*3/uL (ref 4.0–10.5)
nRBC: 0 % (ref 0.0–0.2)

## 2019-04-11 LAB — BASIC METABOLIC PANEL
Anion gap: 11 (ref 5–15)
BUN: 12 mg/dL (ref 6–20)
CO2: 24 mmol/L (ref 22–32)
Calcium: 9.1 mg/dL (ref 8.9–10.3)
Chloride: 104 mmol/L (ref 98–111)
Creatinine, Ser: 0.76 mg/dL (ref 0.44–1.00)
GFR calc Af Amer: >60 mL/min (ref >60)
GFR calc non Af Amer: >60 mL/min (ref >60)
Glucose, Bld: 110 mg/dL — ABNORMAL HIGH (ref 70–99)
Potassium: 3.8 mmol/L (ref 3.5–5.1)
Sodium: 139 mmol/L (ref 135–145)

## 2019-04-11 LAB — PREGNANCY, URINE: Preg Test, Ur: NEGATIVE

## 2019-04-11 LAB — URINALYSIS, COMPLETE (UACMP) WITH MICROSCOPIC
Bacteria, UA: NONE SEEN
Bilirubin Urine: NEGATIVE
Glucose, UA: NEGATIVE mg/dL
Ketones, ur: NEGATIVE mg/dL
Leukocytes,Ua: NEGATIVE
Nitrite: NEGATIVE
Protein, ur: 100 mg/dL — AB
RBC / HPF: >50 RBC/hpf — ABNORMAL HIGH (ref 0–5)
Specific Gravity, Urine: 1.028 (ref 1.005–1.030)
pH: 6 (ref 5.0–8.0)

## 2019-04-11 MED ORDER — KETOROLAC TROMETHAMINE 60 MG/2ML IM SOLN
60.0000 mg | Freq: Once | INTRAMUSCULAR | Status: AC
  Administered 2019-04-11: 12:00:00 60 mg via INTRAMUSCULAR
  Filled 2019-04-11: qty 2

## 2019-04-11 NOTE — ED Provider Notes (Signed)
[facility] Emergency Department Provider Note  Time seen: 11:58 AM  I have reviewed the triage vital signs and the nursing notes.   HISTORY  Chief Complaint Vaginal Bleeding   HPI Becky Chen is a 39 y.o. female with a past medical history of hypertension presents to the emergency department for prolonged menstrual period.  According to the patient back in September she skipped her menstrual period entirely and in October had a prolonged menstrual period lasting 2 to 3 weeks.  Patient did not follow-up with anyone at that time, her menstrual cycle was back to regular however her menstrual cycle started once again 2 weeks ago and it has now been 2 weeks and 2 days per patient.  States moderate vaginal bleeding.  No lightheadedness or dizziness.  States moderate intermittent lower abdominal cramping.  No fever.  Largely negative review of systems.   Past Medical History:  Diagnosis Date  . Herpes genitalis   . Hypertension     There are no problems to display for this patient.   Past Surgical History:  Procedure Laterality Date  . CESAREAN SECTION    . CHOLECYSTECTOMY      Prior to Admission medications   Medication Sig Start Date End Date Taking? Authorizing Provider  acyclovir (ZOVIRAX) 400 MG tablet Take 1 tablet (400 mg total) by mouth 3 (three) times daily. Patient not taking: Reported on 12/08/2018 10/15/14   Menshew, Dannielle Karvonen, PA-C    No Known Allergies  No family history on file.  Social History Social History   Tobacco Use  . Smoking status: Never Smoker  . Smokeless tobacco: Never Used  Substance Use Topics  . Alcohol use: No  . Drug use: No    Review of Systems Constitutional: Negative for fever. Cardiovascular: Negative for chest pain. Respiratory: Negative for shortness of breath. Gastrointestinal: Lower abdominal cramping.  Negative nausea vomiting or diarrhea Genitourinary: Negative for urinary symptoms.  Positive for  vaginal bleeding. Musculoskeletal: Negative for musculoskeletal complaints Neurological: Negative for headache All other ROS negative  ____________________________________________   PHYSICAL EXAM:  VITAL SIGNS: ED Triage Vitals  Enc Vitals Group     BP 04/11/19 1012 (!) 152/106     Pulse Rate 04/11/19 1012 89     Resp 04/11/19 1012 16     Temp 04/11/19 1012 98.4 F (36.9 C)     Temp Source 04/11/19 1012 Oral     SpO2 04/11/19 1012 98 %     Weight 04/11/19 1013 280 lb (127 kg)     Height 04/11/19 1013 5\' 1"  (1.549 m)     Head Circumference --      Peak Flow --      Pain Score 04/11/19 1013 8     Pain Loc --      Pain Edu? --      Excl. in [place]? --    Constitutional: Alert and oriented. Well appearing and in no distress. Eyes: Normal exam ENT      Head: Normocephalic and atraumatic.      Mouth/Throat: Mucous membranes are moist. Cardiovascular: Normal rate, regular rhythm. Respiratory: Normal respiratory effort without tachypnea nor retractions. Breath sounds are clear Gastrointestinal: Soft, slight suprapubic tenderness otherwise benign abdominal exam.  Obese. Musculoskeletal: Nontender with normal range of motion in all extremities. Neurologic:  Normal speech and language. No gross focal neurologic deficits Skin:  Skin is warm, dry and intact.  Psychiatric: Mood and affect are normal.  ____________________________________________   RADIOLOGY  IMPRESSION:  1. Normal pelvic ultrasound. If bleeding remains unresponsive to  hormonal or medical therapy, sonohysterogram should be considered  for focal lesion work-up. (Ref: Radiological Reasoning: Algorithmic  Workup of Abnormal Vaginal Bleeding with Endovaginal Sonography and  Sonohysterography. AJR 2008; 078:[id number])   ____________________________________________   INITIAL IMPRESSION / ASSESSMENT AND PLAN / ED COURSE  Pertinent labs & imaging results that were available during my care of the patient were reviewed by  me and considered in my medical decision making (see chart for details).   Patient presents to the emergency department for lower abdominal cramping and vaginal bleeding ongoing for the past 2 weeks.  States the bleeding and cramping are consistent with her menstrual cycle but is lasting longer than typical.  Similar event occurred in October.  Patient's lab work is overall reassuring hemoglobin is actually increased compared to prior results.  We will obtain a urine for pregnancy test and urinalysis.  We will also obtain an ultrasound to further evaluate.  Patient agreeable to plan of care.  Patient's ultrasound is reassuring.  Lab work reassuring.  We will have the patient follow-up with OB/GYN for further evaluation and possible treatment.  Discussed return precautions with the patient is agreeable to plan of care.  Becky Chen was evaluated in Emergency Department on 04/11/2019 for the symptoms described in the history of present illness. She was evaluated in the context of the global COVID-19 pandemic, which necessitated consideration that the patient might be at risk for infection with the SARS-CoV-2 virus that causes COVID-19. Institutional protocols and algorithms that pertain to the evaluation of patients at risk for COVID-19 are in a state of rapid change based on information released by regulatory bodies including the CDC and federal and state organizations. These policies and algorithms were followed during the patient's care in the ED.  ____________________________________________   FINAL CLINICAL IMPRESSION(S) / ED DIAGNOSES  Dysfunctional uterine bleeding   Minna Antis, MD 04/11/19 1358

## 2019-04-11 NOTE — ED Triage Notes (Signed)
Pt reports she began her period on 1/2, it ended on 1/8 and 2 days later she began having more bleeding. Pt reports she is saturating multiple pads and tampons a day. Reports mild abd cramping.

## 2019-06-21 ENCOUNTER — Other Ambulatory Visit: Payer: Self-pay

## 2019-06-21 ENCOUNTER — Emergency Department
Admission: EM | Admit: 2019-06-21 | Discharge: 2019-06-21 | Disposition: A | Discharge status: Home / Self Care | Payer: Medicaid Other | Attending: Emergency Medicine | Admitting: Emergency Medicine

## 2019-06-21 DIAGNOSIS — I10 Essential (primary) hypertension: Secondary | ICD-10-CM | POA: Insufficient documentation

## 2019-06-21 DIAGNOSIS — N939 Abnormal uterine and vaginal bleeding, unspecified: Secondary | ICD-10-CM

## 2019-06-21 LAB — URINALYSIS, COMPLETE (UACMP) WITH MICROSCOPIC
Bacteria, UA: NONE SEEN
RBC / HPF: >50 RBC/hpf — ABNORMAL HIGH (ref 0–5)
Specific Gravity, Urine: 1.023 (ref 1.005–1.030)
WBC, UA: >50 WBC/hpf — ABNORMAL HIGH (ref 0–5)

## 2019-06-21 LAB — POCT PREGNANCY, URINE: Preg Test, Ur: NEGATIVE

## 2019-06-21 LAB — WET PREP, GENITAL
Clue Cells Wet Prep HPF POC: NONE SEEN
Sperm: NONE SEEN
Trich, Wet Prep: NONE SEEN
WBC, Wet Prep HPF POC: NONE SEEN
Yeast Wet Prep HPF POC: NONE SEEN

## 2019-06-21 LAB — CHLAMYDIA/NGC RT PCR (ARMC ONLY)
Chlamydia Tr: NOT DETECTED
N gonorrhoeae: NOT DETECTED

## 2019-06-21 MED ORDER — PROGESTERONE MICRONIZED 100 MG PO CAPS: 100.0000 mg | ORAL_CAPSULE | Freq: Every day | ORAL | 0 refills | Status: AC

## 2019-06-21 NOTE — ED Notes (Signed)
Lab notified of sending white labels on wet prep and chlamydia d/t technical difficulty printing labels.

## 2019-06-21 NOTE — ED Provider Notes (Signed)
[facility] Emergency Department Provider Note  ____________________________________________  Time seen: Approximately 5:05 PM  I have reviewed the triage vital signs and the nursing notes.   HISTORY  Chief Complaint Vaginal Bleeding    HPI Becky Chen is a 39 y.o. female who presents to the emergency department for treatment and evaluation of vaginal bleeding x 2 weeks with upper abdominal cramping.  She states that the bleeding has slowed down over the past couple days but has not completely stopped.  She does not think she could become pregnant because she had a tubal ligation with her last delivery.  She denies fever, nausea, vomiting, or other symptoms of concern.  Past Medical History:  Diagnosis Date  . Herpes genitalis   . Hypertension     There are no problems to display for this patient.   Past Surgical History:  Procedure Laterality Date  . CESAREAN SECTION    . CHOLECYSTECTOMY      Prior to Admission medications   Medication Sig Start Date End Date Taking? Authorizing Provider  progesterone (PROMETRIUM) 100 MG capsule Take 1 capsule (100 mg total) by mouth at bedtime for 5 days. 06/21/19 06/26/19  Chinita Pester, FNP    Allergies Patient has no known allergies.  No family history on file.  Social History Social History   Tobacco Use  . Smoking status: Never Smoker  . Smokeless tobacco: Never Used  Substance Use Topics  . Alcohol use: No  . Drug use: No    Review of Systems Constitutional: Negative for fever. Respiratory: Negative for shortness of breath or cough. Gastrointestinal: Positive for abdominal pain; negative for nausea , negative for vomiting. Genitourinary: Negative for dysuria , negative for vaginal discharge.  Positive for vaginal bleeding. Musculoskeletal: Negative for back pain. Skin: Negative for acute skin changes/rash/lesion. ____________________________________________   PHYSICAL EXAM:  VITAL  SIGNS: ED Triage Vitals  Enc Vitals Group     BP 06/21/19 1344 (!) 153/95     Pulse Rate 06/21/19 1344 93     Resp 06/21/19 1344 18     Temp 06/21/19 1344 97.6 F (36.4 C)     Temp src --      SpO2 06/21/19 1344 96 %     Weight 06/21/19 1345 270 lb (122.5 kg)     Height 06/21/19 1345 5\' 1"  (1.549 m)     Head Circumference --      Peak Flow --      Pain Score 06/21/19 1345 0     Pain Loc --      Pain Edu? --      Excl. in GC? --     Constitutional: Alert and oriented. Well appearing and in no acute distress. Eyes: Conjunctivae are normal. Head: Atraumatic. Nose: No congestion/rhinnorhea. Mouth/Throat: Mucous membranes are moist. Respiratory: Normal respiratory effort.  No retractions. Gastrointestinal: Bowel sounds active x 4; Abdomen is soft without rebound or guarding. Genitourinary: Pelvic exam: Limited by body habitus. Portion of cervix visualized appeared normal. Dark red blood in vaginal vault without clots.  Musculoskeletal: No extremity tenderness nor edema.  Neurologic:  Normal speech and language. No gross focal neurologic deficits are appreciated. Speech is normal. No gait instability. Skin:  Skin is warm, dry and intact. No rash noted on exposed skin. Psychiatric: Mood and affect are normal. Speech and behavior are normal.  ____________________________________________   LABS (all labs ordered are listed, but only abnormal results are displayed)  Labs Reviewed  URINALYSIS, COMPLETE (UACMP) WITH  MICROSCOPIC - Abnormal; Notable for the following components:      Result Value   Color, Urine RED (*)    APPearance HAZY (*)    Glucose, UA   (*)    Value: TEST NOT REPORTED DUE TO COLOR INTERFERENCE OF URINE PIGMENT   Hgb urine dipstick   (*)    Value: TEST NOT REPORTED DUE TO COLOR INTERFERENCE OF URINE PIGMENT   Bilirubin Urine   (*)    Value: TEST NOT REPORTED DUE TO COLOR INTERFERENCE OF URINE PIGMENT   Ketones, ur   (*)    Value: TEST NOT REPORTED DUE TO COLOR  INTERFERENCE OF URINE PIGMENT   Protein, ur   (*)    Value: TEST NOT REPORTED DUE TO COLOR INTERFERENCE OF URINE PIGMENT   Nitrite   (*)    Value: TEST NOT REPORTED DUE TO COLOR INTERFERENCE OF URINE PIGMENT   Leukocytes,Ua   (*)    Value: TEST NOT REPORTED DUE TO COLOR INTERFERENCE OF URINE PIGMENT   RBC / HPF >50 (*)    WBC, UA >50 (*)    All other components within normal limits  WET PREP, GENITAL  CHLAMYDIA/NGC RT PCR ([facility] ONLY)  POC URINE PREG, ED  POCT PREGNANCY, URINE   ____________________________________________  RADIOLOGY  Not indicated. ____________________________________________  Procedures  ____________________________________________  39 year old female presenting to the emergency department for treatment and evaluation of 2 weeks of vaginal bleeding.  See HPI for further details.  Exam is overall reassuring.  Wet prep was negative for concerns.  Chlamydia and gonorrhea tests are pending, however the patient states that she has not been sexually active and she does not feel that she could potentially have any STDs.  Urinalysis is unreliable, however the patient denies dysuria.  Pregnancy test is negative.  She will be treated with some progesterone for the next few days at night and advised to follow-up with her primary care provider or the [facility].  She is to return to the emergency department for symptoms that change or worsen or for new concerns if she is unable to schedule appointment.  INITIAL IMPRESSION / ASSESSMENT AND PLAN / ED COURSE  Pertinent labs & imaging results that were available during my care of the patient were reviewed by me and considered in my medical decision making (see chart for details).  ____________________________________________   FINAL CLINICAL IMPRESSION(S) / ED DIAGNOSES  Final diagnoses:  Vaginal bleeding    Note:  This document was prepared using Dragon voice recognition software and may  include unintentional dictation errors.   Victorino Dike, FNP 06/21/19 2012    Delman Kitten, MD 06/21/19 2123

## 2019-06-21 NOTE — ED Triage Notes (Signed)
Pt comes via POV from home with c/o vaginal bleeding the patient states she is on her period but it has been going on for 2 weeks.  Pt states irregular periods in past. Pt just unsure why her period is lasting so long. Pt states she has been passing clots as well

## 2019-06-25 ENCOUNTER — Other Ambulatory Visit: Payer: Self-pay

## 2019-06-25 ENCOUNTER — Encounter: Payer: Self-pay | Admitting: Emergency Medicine

## 2019-06-25 ENCOUNTER — Emergency Department
Admission: EM | Admit: 2019-06-25 | Discharge: 2019-06-25 | Disposition: A | Discharge status: Home / Self Care | Payer: Medicaid Other | Attending: Emergency Medicine | Admitting: Emergency Medicine

## 2019-06-25 DIAGNOSIS — E282 Polycystic ovarian syndrome: Secondary | ICD-10-CM | POA: Insufficient documentation

## 2019-06-25 DIAGNOSIS — N938 Other specified abnormal uterine and vaginal bleeding: Secondary | ICD-10-CM | POA: Diagnosis present

## 2019-06-25 HISTORY — DX: Polycystic ovarian syndrome: E28.2

## 2019-06-25 LAB — COMPREHENSIVE METABOLIC PANEL
ALT: 17 U/L (ref 0–44)
AST: 19 U/L (ref 15–41)
Albumin: 3.6 g/dL (ref 3.5–5.0)
Alkaline Phosphatase: 84 U/L (ref 38–126)
Anion gap: 8 (ref 5–15)
BUN: 11 mg/dL (ref 6–20)
CO2: 26 mmol/L (ref 22–32)
Calcium: 9.1 mg/dL (ref 8.9–10.3)
Chloride: 104 mmol/L (ref 98–111)
Creatinine, Ser: 0.85 mg/dL (ref 0.44–1.00)
GFR calc Af Amer: >60 mL/min (ref >60)
GFR calc non Af Amer: >60 mL/min (ref >60)
Glucose, Bld: 109 mg/dL — ABNORMAL HIGH (ref 70–99)
Potassium: 3.9 mmol/L (ref 3.5–5.1)
Sodium: 138 mmol/L (ref 135–145)
Total Bilirubin: 0.4 mg/dL (ref 0.3–1.2)
Total Protein: 8.2 g/dL — ABNORMAL HIGH (ref 6.5–8.1)

## 2019-06-25 LAB — CBC
HCT: 33.7 % — ABNORMAL LOW (ref 36.0–46.0)
Hemoglobin: 10 g/dL — ABNORMAL LOW (ref 12.0–15.0)
MCH: 23.8 pg — ABNORMAL LOW (ref 26.0–34.0)
MCHC: 29.7 g/dL — ABNORMAL LOW (ref 30.0–36.0)
MCV: 80.2 fL (ref 80.0–100.0)
Platelets: 431 10*3/uL — ABNORMAL HIGH (ref 150–400)
RBC: 4.2 MIL/uL (ref 3.87–5.11)
RDW: 17.5 % — ABNORMAL HIGH (ref 11.5–15.5)
WBC: 10.6 10*3/uL — ABNORMAL HIGH (ref 4.0–10.5)
nRBC: 0 % (ref 0.0–0.2)

## 2019-06-25 LAB — POCT PREGNANCY, URINE: Preg Test, Ur: NEGATIVE

## 2019-06-25 MED ORDER — NORGESTIM-ETH ESTRAD TRIPHASIC 0.18/0.215/0.25 MG-25 MCG PO TABS: 1.0000 | ORAL_TABLET | Freq: Every day | ORAL | 0 refills | Status: DC

## 2019-06-25 NOTE — Discharge Instructions (Signed)
You can finish the progesterone.  If this is still not helping you can start some birth control and see if that helps.  However you need to follow-up with OB/GYN for definitive management.  Please call the above number to make an appointment

## 2019-06-25 NOTE — ED Provider Notes (Signed)
[facility] Emergency Department Provider Note  ____________________________________________   First MD Initiated Contact with Patient 06/25/19 1949     (approximate)  I have reviewed the triage vital signs and the nursing notes.   HISTORY  Chief Complaint Vaginal Bleeding    HPI Becky Chen is a 39 y.o. female with report of PCOS who comes in with vaginal bleeding.  On review of records patient was seen on 1/20 for dysfunctional uterine bleeding and had a normal ultrasound.  Patient seen again on 4/1 and was given a 5 days course of progesterone.  Patient states that she did her bleeding started in the middle of March lasted a few days went away and then restarted at the end of March and had stopped yesterday but then restarted again today.  Patient only gone through 2 pads in the last 8 hours.  Bleeding is mild, intermittent, nothing makes it better, nothing makes it worse.  States that she has been dealing with this for a while has not been able to follow-up with OB/GYN          Past Medical History:  Diagnosis Date  . Herpes genitalis   . Hypertension   . PCOS (polycystic ovarian syndrome)     There are no problems to display for this patient.   Past Surgical History:  Procedure Laterality Date  . CESAREAN SECTION    . CHOLECYSTECTOMY      Prior to Admission medications   Medication Sig Start Date End Date Taking? Authorizing Provider  progesterone (PROMETRIUM) 100 MG capsule Take 1 capsule (100 mg total) by mouth at bedtime for 5 days. 06/21/19 06/26/19  Chinita Pester, FNP    Allergies Patient has no known allergies.  History reviewed. No pertinent family history.  Social History Social History   Tobacco Use  . Smoking status: Never Smoker  . Smokeless tobacco: Never Used  Substance Use Topics  . Alcohol use: No  . Drug use: No      Review of Systems Constitutional: No fever/chills Eyes: No visual changes. ENT: No sore  throat. Cardiovascular: Denies chest pain. Respiratory: Denies shortness of breath. Gastrointestinal: No abdominal pain.  No nausea, no vomiting.  No diarrhea.  No constipation. Genitourinary: Negative for dysuria. Musculoskeletal: Negative for back pain. Skin: Negative for rash. Neurological: Negative for headaches, focal weakness or numbness. All other ROS negative ____________________________________________   PHYSICAL EXAM:  VITAL SIGNS: ED Triage Vitals  Enc Vitals Group     BP 06/25/19 1722 (!) 157/99     Pulse Rate 06/25/19 1722 98     Resp 06/25/19 1722 18     Temp 06/25/19 1722 97.9 F (36.6 C)     Temp Source 06/25/19 1722 Oral     SpO2 06/25/19 1722 100 %     Weight 06/25/19 1721 280 lb (127 kg)     Height 06/25/19 1721 5\' 1"  (1.549 m)     Head Circumference --      Peak Flow --      Pain Score 06/25/19 1720 8     Pain Loc --      Pain Edu? --      Excl. in GC? --     Constitutional: Alert and oriented. Well appearing and in no acute distress. Eyes: Conjunctivae are normal. EOMI. Head: Atraumatic. Nose: No congestion/rhinnorhea. Mouth/Throat: Mucous membranes are moist.   Neck: No stridor. Trachea Midline. FROM Cardiovascular: Normal rate, regular rhythm. Grossly normal heart sounds.  Good  peripheral circulation. Respiratory: Normal respiratory effort.  No retractions. Lungs CTAB. Gastrointestinal: Soft and nontender. No distention. No abdominal bruits.  Musculoskeletal: No lower extremity tenderness nor edema.  No joint effusions. Neurologic:  Normal speech and language. No gross focal neurologic deficits are appreciated.  Skin:  Skin is warm, dry and intact. No rash noted. Psychiatric: Mood and affect are normal. Speech and behavior are normal. GU: Deferred, patient declined  ____________________________________________   LABS (all labs ordered are listed, but only abnormal results are displayed)  Labs Reviewed  CBC - Abnormal; Notable for the  following components:      Result Value   WBC 10.6 (*)    Hemoglobin 10.0 (*)    HCT 33.7 (*)    MCH 23.8 (*)    MCHC 29.7 (*)    RDW 17.5 (*)    Platelets 431 (*)    All other components within normal limits  COMPREHENSIVE METABOLIC PANEL - Abnormal; Notable for the following components:   Glucose, Bld 109 (*)    Total Protein 8.2 (*)    All other components within normal limits  POC URINE PREG, ED   ____________________________________________   INITIAL IMPRESSION / ASSESSMENT AND PLAN / ED COURSE  Becky Chen was evaluated in Emergency Department on 06/25/2019 for the symptoms described in the history of present illness. She was evaluated in the context of the global COVID-19 pandemic, which necessitated consideration that the patient might be at risk for infection with the SARS-CoV-2 virus that causes COVID-19. Institutional protocols and algorithms that pertain to the evaluation of patients at risk for COVID-19 are in a state of rapid change based on information released by regulatory bodies including the CDC and federal and state organizations. These policies and algorithms were followed during the patient's care in the ED.    Patient comes in with most likely dysfunctional uterine bleeding.  Possibly secondary to PCOS although prior ultrasound does not show any evidence of cysts are not sure how she has this diagnosis.  Could be secondary to endometrial-itis.  Patient just had an ultrasound in January 2021 that was normal so I do not think we need to repeat that here.  Patient had labs drawn to evaluate for anemia.  Patient is not going through a significant amount of pads.  Hemoglobin is around her baseline at 10.  No evidence of needing a transfusion.  Patient has had tubal ligation but there is no signs of pregnancy to suggest ectopic.  She denies any lower abdominal pain to suggest ovarian torsion.  She was recently tested for STDs and declined pelvic testing today.  Discussed  with patient that she can continue the progesterone that was recently prescribed.  If she continues to have bleeding she consider trial starting on some low-dose birth control to try to help regulate her periods better.  She understands that she needs to follow-up with OB/GYN for definitive management.  Patient provided the number and she stated that she would call and make an appointment  I discussed the provisional nature of ED diagnosis, the treatment so far, the ongoing plan of care, follow up appointments and return precautions with the patient and any family or support people present. They expressed understanding and agreed with the plan, discharged home.  ____________________________________________   FINAL CLINICAL IMPRESSION(S) / ED DIAGNOSES   Final diagnoses:  Dysfunctional uterine bleeding      MEDICATIONS GIVEN DURING THIS VISIT:  Medications - No data to display   ED Discharge Orders  Ordered    Norgestimate-Ethinyl Estradiol Triphasic (ORTHO TRI-CYCLEN LO) 0.18/0.215/0.25 MG-25 MCG tab  Daily     06/25/19 2019           Note:  This document was prepared using Dragon voice recognition software and may include unintentional dictation errors.   Vanessa [place], MD 06/25/19 2156

## 2019-06-25 NOTE — ED Triage Notes (Signed)
Pt here for vaginal bleeding.  Has had intermittent bleeding over past month or so but has bled since 06/11/19 every day. Has used 2 pads today. Ambulatory to triage without difficulty.  Dx here with PCOS as reason for bleeding. Has not FU with OBGYN.  NAD. VSS.

## 2019-07-09 ENCOUNTER — Encounter: Payer: Self-pay | Admitting: Obstetrics & Gynecology

## 2019-07-09 ENCOUNTER — Other Ambulatory Visit: Payer: Self-pay

## 2019-07-09 ENCOUNTER — Ambulatory Visit (INDEPENDENT_AMBULATORY_CARE_PROVIDER_SITE_OTHER): Payer: Medicaid Other | Admitting: Obstetrics & Gynecology

## 2019-07-09 ENCOUNTER — Other Ambulatory Visit (HOSPITAL_COMMUNITY)
Admission: RE | Admit: 2019-07-09 | Discharge: 2019-07-09 | Disposition: A | Discharge status: Home / Self Care | Payer: Medicaid Other | Source: Ambulatory Visit | Attending: Obstetrics & Gynecology | Admitting: Obstetrics & Gynecology

## 2019-07-09 VITALS — BP 120/70 | Ht 61.0 in | Wt 327.0 lb

## 2019-07-09 DIAGNOSIS — R5383 Other fatigue: Secondary | ICD-10-CM | POA: Diagnosis not present

## 2019-07-09 DIAGNOSIS — Z124 Encounter for screening for malignant neoplasm of cervix: Secondary | ICD-10-CM | POA: Diagnosis not present

## 2019-07-09 DIAGNOSIS — N920 Excessive and frequent menstruation with regular cycle: Secondary | ICD-10-CM

## 2019-07-09 MED ORDER — DESOGESTREL-ETHINYL ESTRADIOL 0.15-0.02/0.01 MG (21/5) PO TABS: 1.0000 | ORAL_TABLET | Freq: Every day | ORAL | 11 refills | Status: AC

## 2019-07-09 NOTE — Progress Notes (Signed)
HPI:      [name] is a 39 y.o. G3P3 (s/p CS x3, also s/p BTL) who LMP was second week of March, presents today for a problem visit.  She complains of menorrhagia that  began 1 year ago and its severity is described as moderate.  She has had reg cycles 5-7 days, but this year they are 7-14 days w cramps, clots, fatigue and they are associated with moderate menstrual cramping.  She has used the following for attempts at control: tampon and pad.  Weight gain this past year, now BMI >60 (unsure how much she has gained this year)  Previous evaluation: emergency room visit on 4/5, 1/20 and ultrasound on 1/20 showing normal uterus an dovaries. Prior Diagnosis: dysfunctional uterine bleeding. Previous Treatment: none.  She is has sex with males.  Hx of STDs: none. She is premenopausal.  PMHx: She  has a past medical history of Herpes genitalis, Hypertension, and PCOS (polycystic ovarian syndrome). Also,  has a past surgical history that includes Cholecystectomy and Cesarean section., family history is not on file.,  reports that she has never smoked. She has never used smokeless tobacco. She reports that she does not drink alcohol or use drugs.  She  Current Outpatient Medications:  .  Norgestimate-Ethinyl Estradiol Triphasic (ORTHO TRI-CYCLEN LO) 0.18/0.215/0.25 MG-25 MCG tab, Take 1 tablet by mouth daily. (Patient not taking: Reported on 07/09/2019), Disp: 30 tablet, Rfl: 0 .  progesterone (PROMETRIUM) 100 MG capsule, Take 1 capsule (100 mg total) by mouth at bedtime for 5 days., Disp: 5 capsule, Rfl: 0  Also, has No Known Allergies.  Review of Systems  Constitutional: Positive for malaise/fatigue. Negative for chills and fever.  HENT: Negative for congestion, sinus pain and sore throat.   Eyes: Negative for blurred vision and pain.  Respiratory: Negative for cough and wheezing.   Cardiovascular: Negative for chest pain and leg swelling.  Gastrointestinal: Negative for abdominal pain,  constipation, diarrhea, heartburn, nausea and vomiting.  Genitourinary: Negative for dysuria, frequency, hematuria and urgency.  Musculoskeletal: Negative for back pain, joint pain, myalgias and neck pain.  Skin: Negative for itching and rash.  Neurological: Negative for dizziness, tremors and weakness.  Endo/Heme/Allergies: Does not bruise/bleed easily.  Psychiatric/Behavioral: Negative for depression. The patient is not nervous/anxious and does not have insomnia.    Objective: BP 120/70   Ht 5\' 1"  (1.549 m)   Wt (!) 327 lb (148.3 kg)   LMP 06/21/2019   BMI 61.79 kg/m  Physical Exam Constitutional:      General: She is not in acute distress.    Appearance: She is well-developed.  Genitourinary:     Pelvic exam was performed with patient supine.     Vagina, uterus and rectum normal.     No lesions in the vagina.     No vaginal bleeding.     No cervical motion tenderness, friability, lesion or polyp.     Uterus is mobile.     Uterus is not enlarged.     No uterine mass detected.    Uterus is midaxial.     No right or left adnexal mass present.     Right adnexa not tender.     Left adnexa not tender.  HENT:     Head: Normocephalic and atraumatic. No laceration.     Right Ear: Hearing normal.     Left Ear: Hearing normal.     Mouth/Throat:     Pharynx: Uvula midline.  Eyes:  Pupils: Pupils are equal, round, and reactive to light.  Neck:     Thyroid: No thyromegaly.  Cardiovascular:     Rate and Rhythm: Normal rate and regular rhythm.     Heart sounds: No murmur. No friction rub. No gallop.   Pulmonary:     Effort: Pulmonary effort is normal. No respiratory distress.     Breath sounds: Normal breath sounds. No wheezing.  Abdominal:     General: Bowel sounds are normal. There is no distension.     Palpations: Abdomen is soft.     Tenderness: There is no abdominal tenderness. There is no rebound.  Musculoskeletal:        General: Normal range of motion.     Cervical  back: Normal range of motion and neck supple.  Neurological:     Mental Status: She is alert and oriented to person, place, and time.     Cranial Nerves: No cranial nerve deficit.  Skin:    General: Skin is warm and dry.  Psychiatric:        Judgment: Judgment normal.  Vitals reviewed.    ASSESSMENT/PLAN:  menorrhagia  Problem List Items Addressed This Visit    Menorrhagia with regular cycle    -  Primary    Options for hormonal mgt discussed      Patient has abnormal uterine bleeding . She has a normal exam today, with no evidence of lesions.  Evaluation includes the following: exam, labs such as hormonal testing, and pelvic ultrasound to evaluate for any structural gynecologic abnormalities (done 03/2019).  Treatment option for menorrhagia or menometrorrhagia discussed in great detail with the patient.  Options include hormonal therapy, IUD therapy such as Mirena, D&C, Ablation, and Hysterectomy.  The pros and cons of each option discussed with patient.    Plan OCP therapy, to start after next cycle.    Also plan weight loss, as this may help w periods and is also very much needed for her health    OCPs The risks /benefits of OCPs have been explained to the patient in detail.  Product literature has been given to her.  I have instructed her in the use of OCPs and have given her literature reinforcing this information.  I have explained to the patient that OCPs are not as effective for birth control during the first month of use, and that another form of contraception should be used during this time.  Both first-day start and Sunday start have been explained.  The risks and benefits of each was discussed.  She has been made aware of  the fact that other medications may affect the efficacy of OCPs.  I have answered all of her questions, and I believe that she has an understanding of the effectiveness and use of OCPs.    Fatigue, unspecified type        Related to above    Screening for  cervical cancer       Relevant Orders   Cytology - PAP    Annamarie Major, MD, [facility], [facility] 07/09/2019  10:15 AM

## 2019-07-09 NOTE — Patient Instructions (Signed)
Dysfunctional Uterine Bleeding Dysfunctional uterine bleeding is abnormal bleeding from the uterus. Dysfunctional uterine bleeding includes:  A menstrual period that comes earlier or later than usual.  A menstrual period that is lighter or heavier than usual, or has large blood clots.  Vaginal bleeding between menstrual periods.  Skipping one or more menstrual periods.  Vaginal bleeding after sex.  Vaginal bleeding after menopause. Follow these instructions at home: Eating and drinking   Eat well-balanced meals. Include foods that are high in iron, such as liver, meat, shellfish, green leafy vegetables, and eggs.  To prevent or treat constipation, your health care provider may recommend that you: ? Drink enough fluid to keep your urine pale yellow. ? Take over-the-counter or prescription medicines. ? Eat foods that are high in fiber, such as beans, whole grains, and fresh fruits and vegetables. ? Limit foods that are high in fat and processed sugars, such as fried or sweet foods. Medicines  Take over-the-counter and prescription medicines only as told by your health care provider.  Do not change medicines without talking with your health care provider.  Aspirin or medicines that contain aspirin may make the bleeding worse. Do not take those medicines: ? During the week before your menstrual period. ? During your menstrual period.  If you were prescribed iron pills, take them as told by your health care provider. Iron pills help to replace iron that your body loses because of this condition. Activity  If you need to change your sanitary pad or tampon more than one time every 2 hours: ? Lie in bed with your feet raised (elevated). ? Place a cold pack on your lower abdomen. ? Rest as much as possible until the bleeding stops or slows down.  Do not try to lose weight until the bleeding has stopped and your blood iron level is back to normal. General instructions   For two  months, write down: ? When your menstrual period starts. ? When your menstrual period ends. ? When any abnormal vaginal bleeding occurs. ? What problems you notice.  Keep all follow up visits as told by your health care provider. This is important. Contact a health care provider if you:  Feel light-headed or weak.  Have nausea and vomiting.  Cannot eat or drink without vomiting.  Feel dizzy or have diarrhea while you are taking medicines.  Are taking birth control pills or hormones, and you want to change them or stop taking them. Get help right away if:  You develop a fever or chills.  You need to change your sanitary pad or tampon more than one time per hour.  Your vaginal bleeding becomes heavier, or your flow contains clots more often.  You develop pain in your abdomen.  You lose consciousness.  You develop a rash. Summary  Dysfunctional uterine bleeding is abnormal bleeding from the uterus.  It includes menstrual bleeding of abnormal duration, volume, or regularity.  Bleeding after sex and after menopause are also considered dysfunctional uterine bleeding. This information is not intended to replace advice given to you by your health care provider. Make sure you discuss any questions you have with your health care provider. Document Revised: 08/17/2017 Document Reviewed: 08/17/2017 Elsevier Patient Education  2020 [employer]  

## 2019-07-11 LAB — CYTOLOGY - PAP
Adequacy: ABSENT
Comment: NEGATIVE
Diagnosis: NEGATIVE
Diagnosis: REACTIVE
High risk HPV: POSITIVE — AB

## 2019-10-09 ENCOUNTER — Emergency Department: Payer: Medicaid Other

## 2019-10-09 ENCOUNTER — Emergency Department
Admission: EM | Admit: 2019-10-09 | Discharge: 2019-10-09 | Disposition: A | Discharge status: Home / Self Care | Payer: Medicaid Other | Attending: Emergency Medicine | Admitting: Emergency Medicine

## 2019-10-09 ENCOUNTER — Other Ambulatory Visit: Payer: Self-pay

## 2019-10-09 DIAGNOSIS — M25561 Pain in right knee: Secondary | ICD-10-CM | POA: Diagnosis not present

## 2019-10-09 DIAGNOSIS — I10 Essential (primary) hypertension: Secondary | ICD-10-CM | POA: Insufficient documentation

## 2019-10-09 DIAGNOSIS — M25569 Pain in unspecified knee: Secondary | ICD-10-CM

## 2019-10-09 DIAGNOSIS — Z79899 Other long term (current) drug therapy: Secondary | ICD-10-CM | POA: Diagnosis not present

## 2019-10-09 MED ORDER — MELOXICAM 15 MG PO TABS: 15.0000 mg | ORAL_TABLET | Freq: Every day | ORAL | 0 refills | Status: DC

## 2019-10-09 MED ORDER — TRAMADOL HCL 50 MG PO TABS: 50.0000 mg | ORAL_TABLET | Freq: Four times a day (QID) | ORAL | 0 refills | Status: DC | PRN

## 2019-10-09 MED ORDER — TRAMADOL HCL 50 MG PO TABS
50.0000 mg | ORAL_TABLET | Freq: Once | ORAL | Status: AC
  Administered 2019-10-09: 50 mg via ORAL
  Filled 2019-10-09: qty 1

## 2019-10-09 NOTE — Discharge Instructions (Signed)
Follow-up with your primary care provider if not improving or any worsening of your knee pain you may need to follow-up with Dr. Allena Katz who is on-call for orthopedics.  His contact information is listed on your discharge papers.  Begin taking meloxicam 15 mg 1 daily with food and tramadol if needed for moderate to severe pain.  Ice and elevate your knee when possible.  The wrap that was applied to your knee today is for added support.

## 2019-10-09 NOTE — ED Triage Notes (Signed)
Pt arrives via POV for c/o right knee pain x 3 days. Pt states she got up from the cough and knee started hurting. No known injury to knee. PT in NAD. Denies swelling.

## 2019-10-09 NOTE — ED Notes (Signed)
X-ray at bedside

## 2019-10-09 NOTE — ED Notes (Signed)
ED Provider at bedside. 

## 2019-10-09 NOTE — ED Provider Notes (Signed)
[facility] Emergency Department Provider Note  ____________________________________________   First MD Initiated Contact with Patient 10/09/19 1011     (approximate)  I have reviewed the triage vital signs and the nursing notes.   HISTORY  Chief Complaint Knee Pain   HPI Barney Gertsch is a 39 y.o. female presents to the ED with complaint of right knee pain for the last 3 days.  Patient states that she got up from the sofa and that her knee started hurting.  Patient has continued to be ambulatory without any assistance for the last 3 days.  She denies any known injury to her knee.  Currently she rates her pain as an 8 out of 10.         Past Medical History:  Diagnosis Date  . Herpes genitalis   . Hypertension   . PCOS (polycystic ovarian syndrome)     There are no problems to display for this patient.   Past Surgical History:  Procedure Laterality Date  . CESAREAN SECTION    . CHOLECYSTECTOMY      Prior to Admission medications   Medication Sig Start Date End Date Taking? Authorizing Provider  desogestrel-ethinyl estradiol (KARIVA) 0.15-0.02/0.01 MG (21/5) tablet Take 1 tablet by mouth daily. 07/09/19   Nadara Mustard, MD  meloxicam (MOBIC) 15 MG tablet Take 1 tablet (15 mg total) by mouth daily. 10/09/19 10/08/20  Tommi Rumps, PA-C  Norgestimate-Ethinyl Estradiol Triphasic (ORTHO TRI-CYCLEN LO) 0.18/0.215/0.25 MG-25 MCG tab Take 1 tablet by mouth daily. Patient not taking: Reported on 07/09/2019 06/25/19 07/25/19  Concha Se, MD  progesterone (PROMETRIUM) 100 MG capsule Take 1 capsule (100 mg total) by mouth at bedtime for 5 days. 06/21/19 06/26/19  Triplett, Kasandra Knudsen, FNP  traMADol (ULTRAM) 50 MG tablet Take 1 tablet (50 mg total) by mouth every 6 (six) hours as needed. 10/09/19   Tommi Rumps, PA-C    Allergies Patient has no known allergies.  History reviewed. No pertinent family history.  Social History Social History   Tobacco  Use  . Smoking status: Never Smoker  . Smokeless tobacco: Never Used  Vaping Use  . Vaping Use: Never used  Substance Use Topics  . Alcohol use: No  . Drug use: No    Review of Systems Constitutional: No fever/chills Cardiovascular: Denies chest pain. Respiratory: Denies shortness of breath. Gastrointestinal: No abdominal pain.  No nausea, no vomiting.  Musculoskeletal: Positive for right knee pain. Skin: Negative for rash. Neurological: Negative for  focal weakness or numbness. ____________________________________________   PHYSICAL EXAM:  VITAL SIGNS: ED Triage Vitals  Enc Vitals Group     BP 10/09/19 0915 (!) 156/102     Pulse Rate 10/09/19 0915 (!) 103     Resp 10/09/19 0915 17     Temp 10/09/19 0915 98.8 F (37.1 C)     Temp Source 10/09/19 0915 Oral     SpO2 10/09/19 0915 99 %     Weight 10/09/19 0921 286 lb (129.7 kg)     Height 10/09/19 0921 5\' 1"  (1.549 m)     Head Circumference --      Peak Flow --      Pain Score 10/09/19 0921 8     Pain Loc --      Pain Edu? --      Excl. in GC? --     Constitutional: Alert and oriented. Well appearing and in no acute distress.  BMI is 54 Eyes: Conjunctivae are  normal.  Head: Atraumatic. Neck: No stridor.   Cardiovascular: Normal rate, regular rhythm. Grossly normal heart sounds.  Good peripheral circulation. Respiratory: Normal respiratory effort.  No retractions. Lungs CTAB. Musculoskeletal: Examination right knee there is no gross deformity.  Patient is able to flex and extend her lower extremity slowly.  No ligament instability noted.  No effusion noted on examination of the patella.  Skin is intact.  No ecchymosis or abrasions were seen. Neurologic:  Normal speech and language. No gross focal neurologic deficits are appreciated. No gait instability. Skin:  Skin is warm, dry and intact. No rash noted. Psychiatric: Mood and affect are normal. Speech and behavior are  normal.  ____________________________________________   LABS (all labs ordered are listed, but only abnormal results are displayed)  Labs Reviewed - No data to display  RADIOLOGY  Official radiology report(s): DG Knee 1-2 Views Right  Result Date: 10/09/2019 CLINICAL DATA:  Possible patellar subluxation EXAM: RIGHT KNEE - 1 VIEW COMPARISON:  Films from earlier in the same day. FINDINGS: The patella is mildly laterally oriented although no true dislocation is seen. IMPRESSION: Mild lateral orientation of the patella without definitive dislocation. Electronically Signed   By: Alcide Clever M.D.   On: 10/09/2019 11:15   DG Knee Complete 4 Views Right  Result Date: 10/09/2019 CLINICAL DATA:  Knee pain for 3 days.  No known injury. EXAM: RIGHT KNEE - COMPLETE 4+ VIEW COMPARISON:  09/20/2009 FINDINGS: Well corticated beat bony excrescence from the medial femoral condyle with curvilinear shape paralleling the femoral cortex. No signs of acute fracture or dislocation. Question of small joint effusion. AP view with question of mild lateral subluxation of the patella versus is rotation. 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 Question of small joint effusion. Potential subluxation of the patella.  Sunrise views may be helpful. Electronically Signed   By: Donzetta Kohut M.D.   On: 10/09/2019 10:09    ____________________________________________   PROCEDURES  Procedure(s) performed (including Critical Care):  Procedures   ____________________________________________   INITIAL IMPRESSION / ASSESSMENT AND PLAN / ED COURSE  As part of my medical decision making, I reviewed the following data within the electronic MEDICAL RECORD NUMBER Notes from prior ED visits and [place] Controlled Substance Database  39 year old female presents to the ED with complaint of right knee pain after getting up from the sofa 3  days ago.  Patient has continued to be ambulatory but pain has persisted.  X-rays were negative for acute bony injury.  The knee immobilizer is that we have in the hospital and will not fit the patient therefore a Jones wrap was applied to her right knee for added support.  She is encouraged to ice and elevate as needed to help with her knee pain.  Also a prescription for tramadol and meloxicam was sent to her pharmacy.  She is to follow-up with her PCP if any continued problems or Dr. Allena Katz if worsening of her knee complaints.  ____________________________________________   FINAL CLINICAL IMPRESSION(S) / ED DIAGNOSES  Final diagnoses:  Acute pain of right knee     ED Discharge Orders         Ordered    traMADol (ULTRAM) 50 MG tablet  Every 6 hours PRN     Discontinue  Reprint     10/09/19 1140    meloxicam (MOBIC) 15 MG tablet  Daily     Discontinue  Reprint  10/09/19 1140           Note:  This document was prepared using Dragon voice recognition software and may include unintentional dictation errors.    Tommi Rumps, PA-C 10/09/19 1416    Emily Filbert, MD 10/09/19 [phone]

## 2019-11-02 ENCOUNTER — Emergency Department
Admission: EM | Admit: 2019-11-02 | Discharge: 2019-11-02 | Disposition: A | Discharge status: Home / Self Care | Payer: Medicaid Other | Attending: Emergency Medicine | Admitting: Emergency Medicine

## 2019-11-02 ENCOUNTER — Encounter: Payer: Self-pay | Admitting: Emergency Medicine

## 2019-11-02 ENCOUNTER — Other Ambulatory Visit: Payer: Self-pay

## 2019-11-02 DIAGNOSIS — N760 Acute vaginitis: Secondary | ICD-10-CM | POA: Insufficient documentation

## 2019-11-02 DIAGNOSIS — B9689 Other specified bacterial agents as the cause of diseases classified elsewhere: Secondary | ICD-10-CM

## 2019-11-02 DIAGNOSIS — N898 Other specified noninflammatory disorders of vagina: Secondary | ICD-10-CM | POA: Diagnosis present

## 2019-11-02 DIAGNOSIS — I10 Essential (primary) hypertension: Secondary | ICD-10-CM | POA: Diagnosis not present

## 2019-11-02 LAB — WET PREP, GENITAL
Sperm: NONE SEEN
Trich, Wet Prep: NONE SEEN
Yeast Wet Prep HPF POC: NONE SEEN

## 2019-11-02 LAB — POCT PREGNANCY, URINE: Preg Test, Ur: NEGATIVE

## 2019-11-02 LAB — URINALYSIS, COMPLETE (UACMP) WITH MICROSCOPIC
Bilirubin Urine: NEGATIVE
Glucose, UA: NEGATIVE mg/dL
Hgb urine dipstick: NEGATIVE
Ketones, ur: NEGATIVE mg/dL
Leukocytes,Ua: NEGATIVE
Nitrite: NEGATIVE
Protein, ur: 30 mg/dL — AB
Specific Gravity, Urine: 1.031 — ABNORMAL HIGH (ref 1.005–1.030)
pH: 5 (ref 5.0–8.0)

## 2019-11-02 MED ORDER — METRONIDAZOLE 500 MG PO TABS: 500.0000 mg | ORAL_TABLET | Freq: Two times a day (BID) | ORAL | 0 refills | Status: AC

## 2019-11-02 NOTE — ED Triage Notes (Signed)
Here for vaginal discharge. Unsure of color. States "I have a bacterial infection". Denies pain or itching.

## 2019-11-02 NOTE — ED Provider Notes (Signed)
[facility] Emergency Department Provider Note  ____________________________________________   First MD Initiated Contact with Patient 11/02/19 1506     (approximate)  I have reviewed the triage vital signs and the nursing notes.   HISTORY  Chief Complaint Vaginal Discharge    HPI Becky Chen is a 39 y.o. female presents emergency department complaining of vaginal discharge, and is also with her daughter who is also complaining of vaginal discharge.   She denies any fever or chills.  No abdominal pain.  Patient states she is very prone to bacterial vaginosis and think that is the bacterial infection that she has at this time.  No concerns for STD or pregnancy.  Patient.   Past Medical History:  Diagnosis Date  . Herpes genitalis   . Hypertension   . PCOS (polycystic ovarian syndrome)     There are no problems to display for this patient.   Past Surgical History:  Procedure Laterality Date  . CESAREAN SECTION    . CHOLECYSTECTOMY      Prior to Admission medications   Medication Sig Start Date End Date Taking? Authorizing Provider  desogestrel-ethinyl estradiol (KARIVA) 0.15-0.02/0.01 MG (21/5) tablet Take 1 tablet by mouth daily. 07/09/19   Nadara Mustard, MD  meloxicam (MOBIC) 15 MG tablet Take 1 tablet (15 mg total) by mouth daily. 10/09/19 10/08/20  Tommi Rumps, PA-C  metroNIDAZOLE (FLAGYL) 500 MG tablet Take 1 tablet (500 mg total) by mouth 2 (two) times daily for 7 days. 11/02/19 11/09/19  Sherrie Mustache Roselyn Bering, PA-C  Norgestimate-Ethinyl Estradiol Triphasic (ORTHO TRI-CYCLEN LO) 0.18/0.215/0.25 MG-25 MCG tab Take 1 tablet by mouth daily. Patient not taking: Reported on 07/09/2019 06/25/19 07/25/19  Concha Se, MD  progesterone (PROMETRIUM) 100 MG capsule Take 1 capsule (100 mg total) by mouth at bedtime for 5 days. 06/21/19 06/26/19  Triplett, Kasandra Knudsen, FNP  traMADol (ULTRAM) 50 MG tablet Take 1 tablet (50 mg total) by mouth every 6 (six) hours as  needed. 10/09/19   Tommi Rumps, PA-C    Allergies Patient has no known allergies.  History reviewed. No pertinent family history.  Social History Social History   Tobacco Use  . Smoking status: Never Smoker  . Smokeless tobacco: Never Used  Vaping Use  . Vaping Use: Never used  Substance Use Topics  . Alcohol use: No  . Drug use: No    Review of Systems  Constitutional: No fever/chills Eyes: No visual changes. ENT: No sore throat. Respiratory: Denies cough Cardiovascular: Denies chest pain Gastrointestinal: Denies abdominal pain Genitourinary: Negative for dysuria.  Positive vaginal discharge Musculoskeletal: Negative for back pain. Skin: Negative for rash. Psychiatric: no mood changes,     ____________________________________________   PHYSICAL EXAM:  VITAL SIGNS: ED Triage Vitals [11/02/19 1416]  Enc Vitals Group     BP 120/88     Pulse Rate 96     Resp 18     Temp 97.9 F (36.6 C)     Temp Source Oral     SpO2 95 %     Weight 289 lb (131.1 kg)     Height 5\' 1"  (1.549 m)     Head Circumference      Peak Flow      Pain Score 0     Pain Loc      Pain Edu?      Excl. in GC?     Constitutional: Alert and oriented. Well appearing and in no acute distress. Eyes: Conjunctivae  are normal.  Head: Atraumatic. Nose: No congestion/rhinnorhea. Mouth/Throat: Mucous membranes are moist.   Neck:  supple no lymphadenopathy noted Cardiovascular: Normal rate, regular rhythm. Heart sounds are normal Respiratory: Normal respiratory effort.  No retractions, lungs c t a  Abd: soft nontender bs normal all 4 quad GU: deferred by the patient Musculoskeletal: FROM all extremities, warm and well perfused Neurologic:  Normal speech and language.  Skin:  Skin is warm, dry and intact. No rash noted. Psychiatric: Mood and affect are normal. Speech and behavior are normal.  ____________________________________________   LABS (all labs ordered are listed, but only  abnormal results are displayed)  Labs Reviewed  WET PREP, GENITAL - Abnormal; Notable for the following components:      Result Value   Clue Cells Wet Prep HPF POC PRESENT (*)    WBC, Wet Prep HPF POC RARE (*)    All other components within normal limits  URINALYSIS, COMPLETE (UACMP) WITH MICROSCOPIC - Abnormal; Notable for the following components:   Color, Urine YELLOW (*)    APPearance CLOUDY (*)    Specific Gravity, Urine 1.031 (*)    Protein, ur 30 (*)    Bacteria, UA RARE (*)    All other components within normal limits  POC URINE PREG, ED  POCT PREGNANCY, URINE   ____________________________________________   ____________________________________________  RADIOLOGY    ____________________________________________   PROCEDURES  Procedure(s) performed: No  Procedures    ____________________________________________   INITIAL IMPRESSION / ASSESSMENT AND PLAN / ED COURSE  Pertinent labs & imaging results that were available during my care of the patient were reviewed by me and considered in my medical decision making (see chart for details).   Patient is 39 year old female presents emergency department with concerns of vaginal discharge  Physical exam shows patient appear well.  Exam is unremarkable.  Pelvic exam was refused by the patient  Patient states she would like to self swab for the wet prep.  Wet prep shows clue cells UA is normal POC pregnancy is negative  I did explain all findings to the patient.  She is to take the Flagyl as prescribed.  Follow-up with her regular physician, [facility] department if needed.  Return if worsening.    Becky Chen was evaluated in Emergency Department on 11/02/2019 for the symptoms described in the history of present illness. She was evaluated in the context of the global COVID-19 pandemic, which necessitated consideration that the patient might be at risk for infection with the SARS-CoV-2 virus that  causes COVID-19. Institutional protocols and algorithms that pertain to the evaluation of patients at risk for COVID-19 are in a state of rapid change based on information released by regulatory bodies including the CDC and federal and state organizations. These policies and algorithms were followed during the patient's care in the ED.    As part of my medical decision making, I reviewed the following data within the electronic MEDICAL RECORD NUMBER Nursing notes reviewed and incorporated, Labs reviewed , Old chart reviewed, Notes from prior ED visits and [place] Controlled Substance Database  ____________________________________________   FINAL CLINICAL IMPRESSION(S) / ED DIAGNOSES  Final diagnoses:  BV (bacterial vaginosis)      NEW MEDICATIONS STARTED DURING THIS VISIT:  New Prescriptions   METRONIDAZOLE (FLAGYL) 500 MG TABLET    Take 1 tablet (500 mg total) by mouth 2 (two) times daily for 7 days.     Note:  This document was prepared using Conservation officer, historic buildings and  may include unintentional dictation errors.    Faythe Ghee, PA-C 11/02/19 1625    Gilles Chiquito, MD 11/02/19 2029

## 2019-11-02 NOTE — Discharge Instructions (Addendum)
Follow-up with your regular doctor or the [facility] department as needed.  Use medication as prescribed.

## 2019-11-02 NOTE — ED Notes (Signed)
See triage note, pt reports she thinks she has bacterial infection. Reports brownish colored vaginal discharge x3 days. Denies burning with urination. Pt in NAD

## 2020-08-18 ENCOUNTER — Encounter: Payer: Self-pay | Admitting: Emergency Medicine

## 2020-08-18 ENCOUNTER — Emergency Department
Admission: EM | Admit: 2020-08-18 | Discharge: 2020-08-18 | Disposition: A | Discharge status: Home / Self Care | Payer: Medicaid Other | Attending: Emergency Medicine | Admitting: Emergency Medicine

## 2020-08-18 DIAGNOSIS — Y92410 Unspecified street and highway as the place of occurrence of the external cause: Secondary | ICD-10-CM | POA: Diagnosis not present

## 2020-08-18 DIAGNOSIS — S34109A Unspecified injury to unspecified level of lumbar spinal cord, initial encounter: Secondary | ICD-10-CM | POA: Diagnosis present

## 2020-08-18 DIAGNOSIS — S39012A Strain of muscle, fascia and tendon of lower back, initial encounter: Secondary | ICD-10-CM | POA: Diagnosis not present

## 2020-08-18 DIAGNOSIS — I10 Essential (primary) hypertension: Secondary | ICD-10-CM | POA: Insufficient documentation

## 2020-08-18 MED ORDER — IBUPROFEN 800 MG PO TABS: 800.0000 mg | ORAL_TABLET | Freq: Three times a day (TID) | ORAL | 0 refills | Status: AC | PRN

## 2020-08-18 MED ORDER — CYCLOBENZAPRINE HCL 10 MG PO TABS
10.0000 mg | ORAL_TABLET | Freq: Once | ORAL | Status: AC
  Administered 2020-08-18: 10 mg via ORAL
  Filled 2020-08-18: qty 1

## 2020-08-18 MED ORDER — CYCLOBENZAPRINE HCL 5 MG PO TABS: 5.0000 mg | ORAL_TABLET | Freq: Three times a day (TID) | ORAL | 0 refills | Status: AC | PRN
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
## 2021-03-02 IMAGING — DX DG KNEE COMPLETE 4+V*R*
4 series · 4 of 4 positions shown · non-contrast
Comparison: 09/20/2009

CLINICAL DATA: Knee pain for 3 days.  No known injury.

EXAM:
RIGHT KNEE - COMPLETE 4+ VIEW

[knee ap]
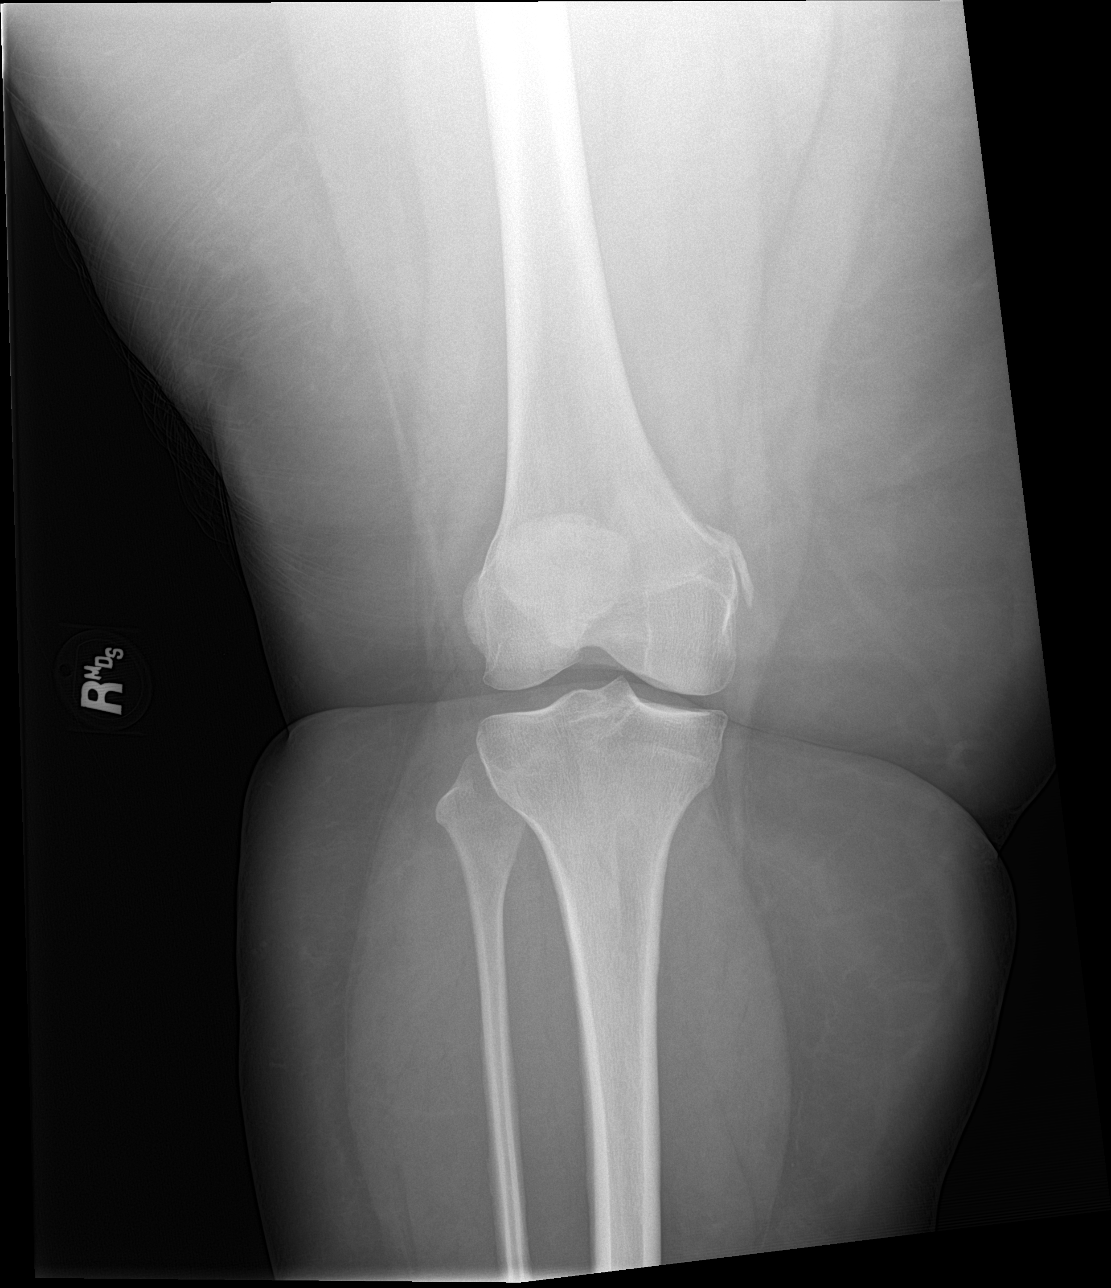

[knee lat]
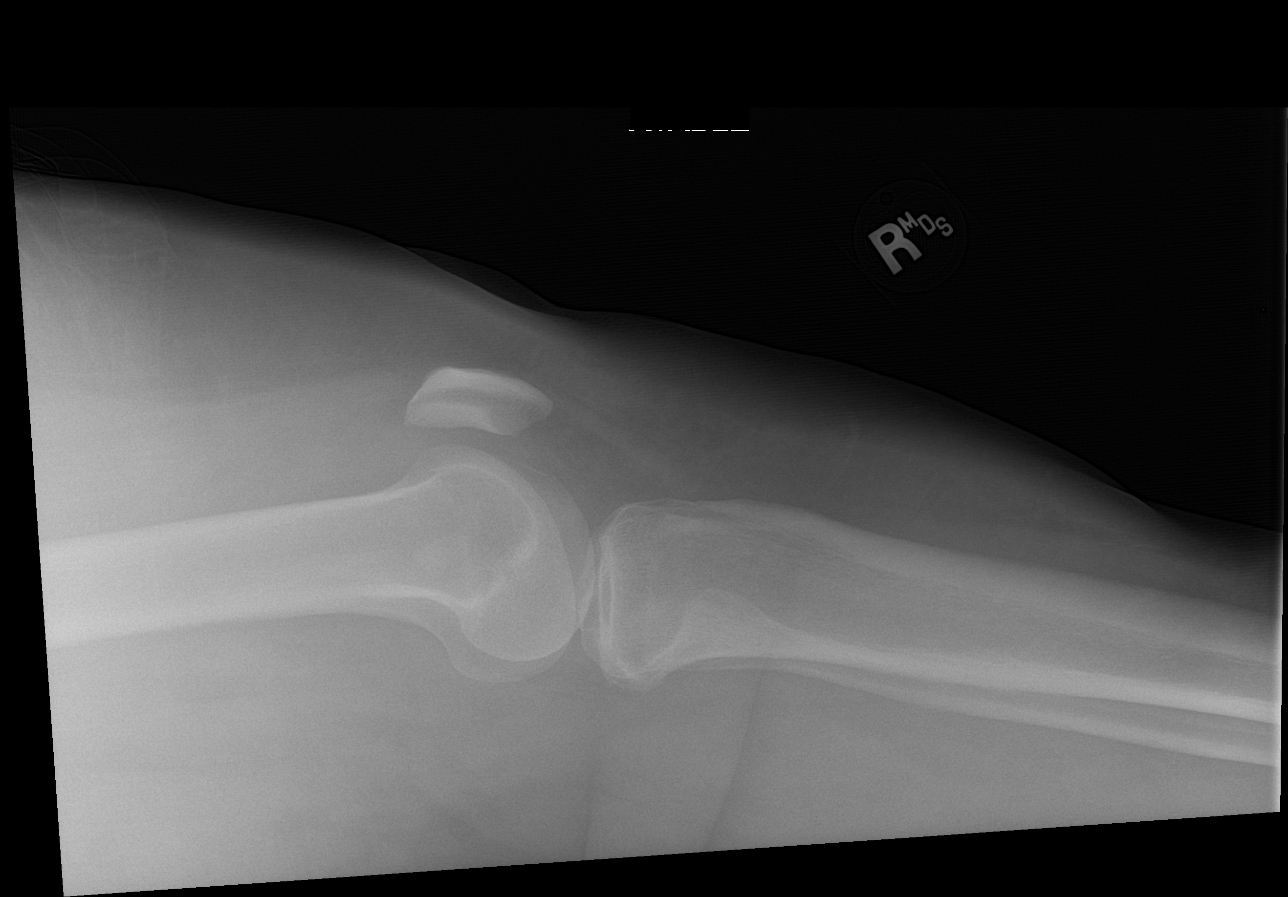

[knee obl (1 of 2)]
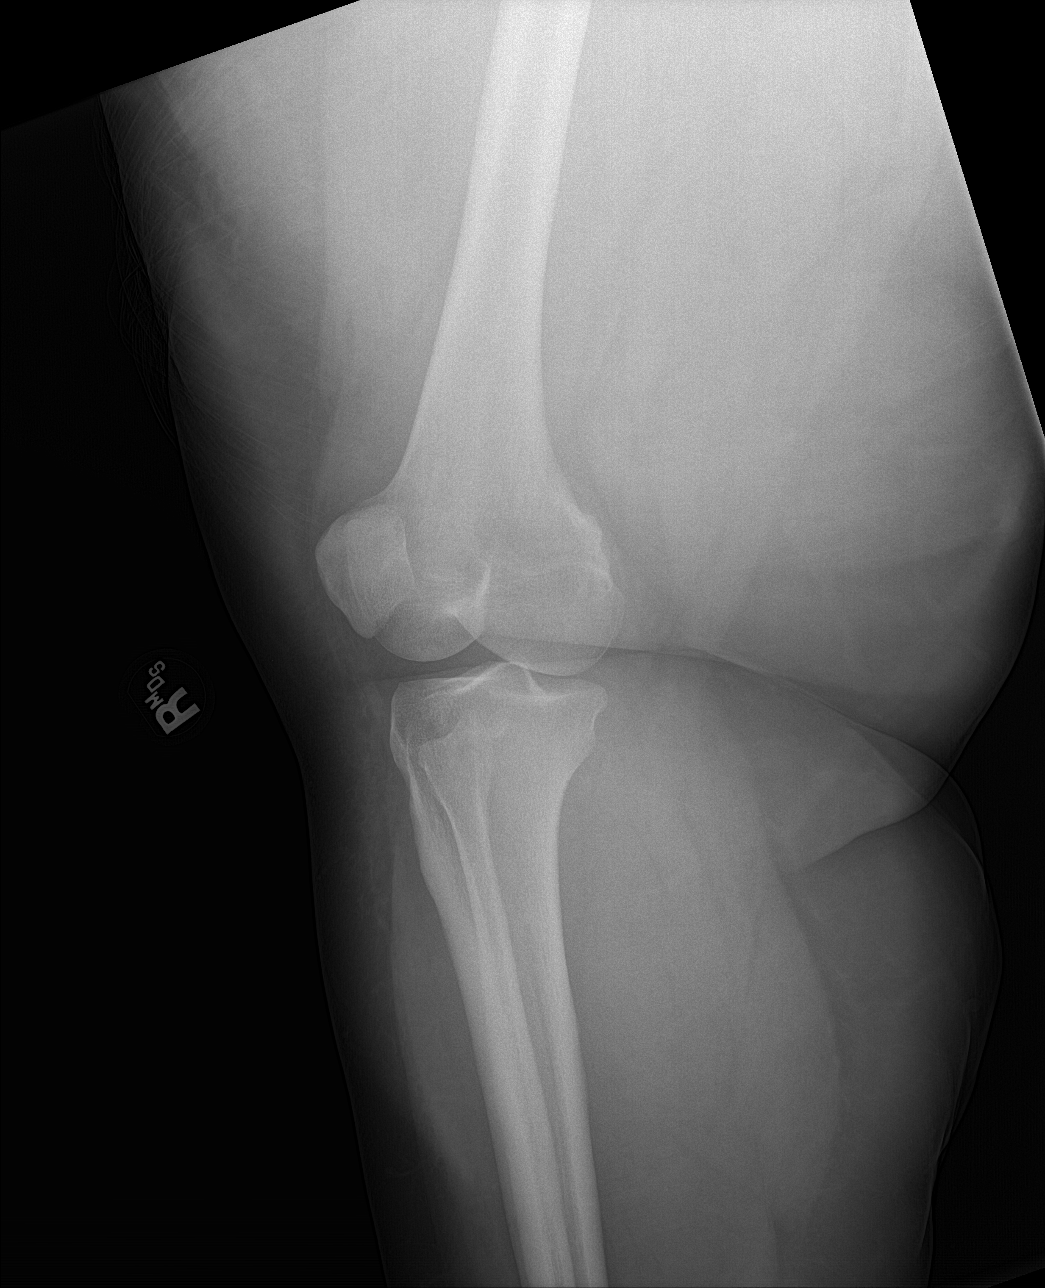

[knee obl (2 of 2)]
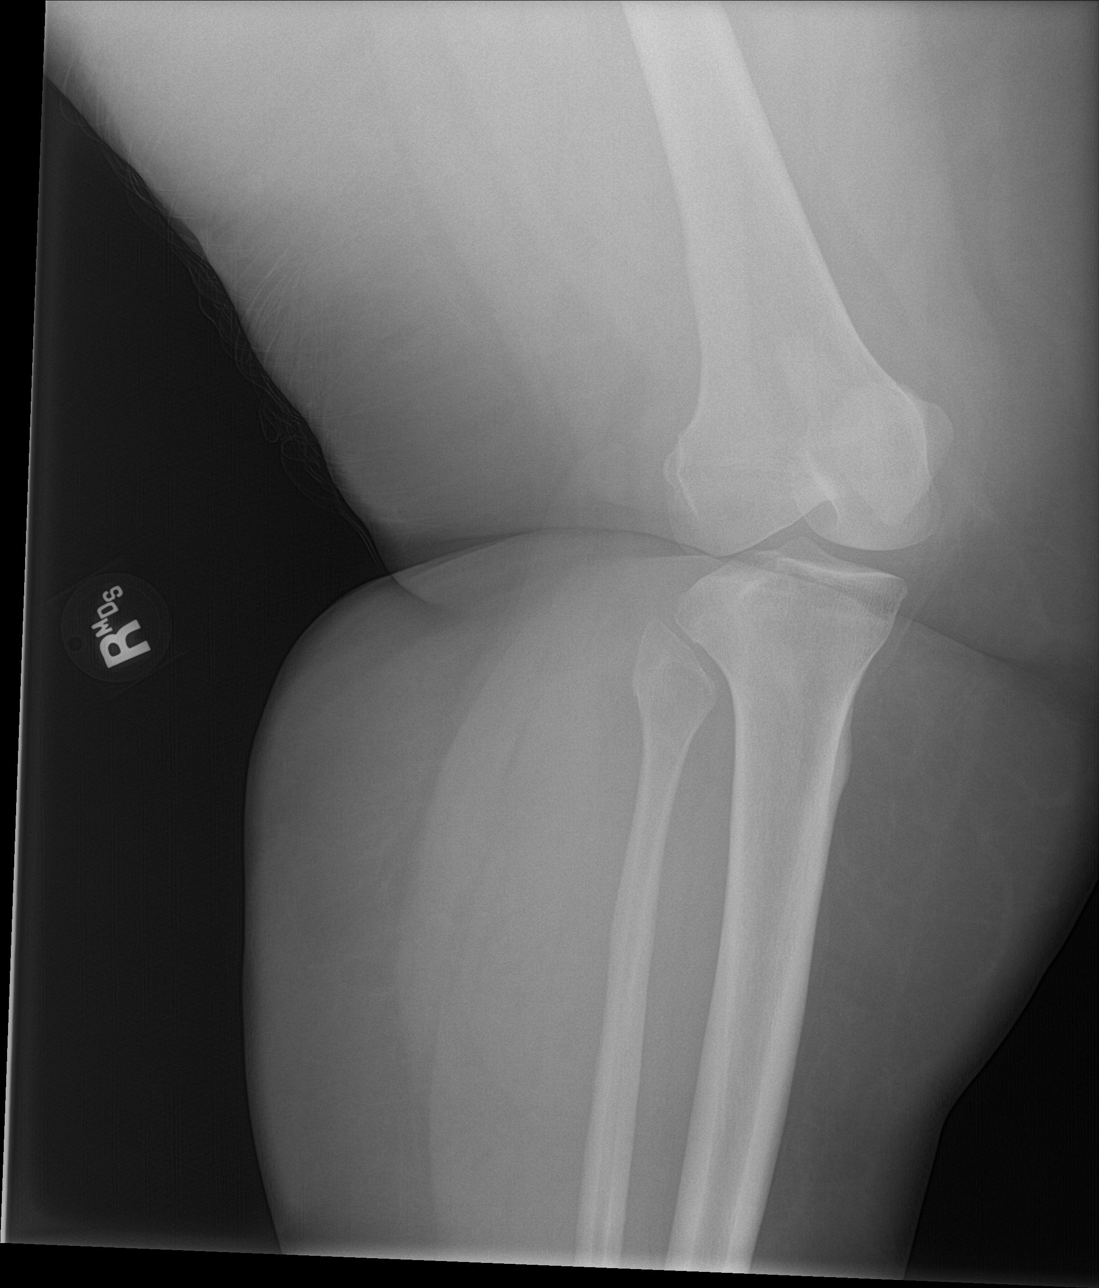

[4 of 4 positions shown; findings below may reference images not displayed]

FINDINGS: Well corticated beat bony excrescence from the medial femoral
condyle with curvilinear shape paralleling the femoral cortex.

No signs of acute fracture or dislocation. Question of small joint
effusion.

AP view with question of mild lateral subluxation of the patella
versus is rotation.
IMPRESSION: Findings of Pellegrini Stieda lesion along the medial femoral
condyle, reflects remote avulsion of medial collateral ligament.

Question of small joint effusion.

Potential subluxation of the patella.  Sunrise views may be helpful.

## 2022-02-02 ENCOUNTER — Encounter (INDEPENDENT_AMBULATORY_CARE_PROVIDER_SITE_OTHER): Payer: Medicaid Other | Admitting: Vascular Surgery

## 2022-03-22 ENCOUNTER — Emergency Department: Payer: Medicaid Other

## 2022-03-22 ENCOUNTER — Emergency Department
Admission: EM | Admit: 2022-03-22 | Discharge: 2022-03-23 | Discharge status: Against Medical Advice | Payer: Medicaid Other | Attending: Emergency Medicine | Admitting: Emergency Medicine

## 2022-03-22 ENCOUNTER — Other Ambulatory Visit: Payer: Self-pay

## 2022-03-22 DIAGNOSIS — R079 Chest pain, unspecified: Secondary | ICD-10-CM | POA: Diagnosis not present

## 2022-03-22 DIAGNOSIS — Z5321 Procedure and treatment not carried out due to patient leaving prior to being seen by health care provider: Secondary | ICD-10-CM | POA: Diagnosis not present

## 2022-03-22 LAB — CBC
HCT: 39.7 % (ref 36.0–46.0)
Hemoglobin: 11.1 g/dL — ABNORMAL LOW (ref 12.0–15.0)
MCH: 21.2 pg — ABNORMAL LOW (ref 26.0–34.0)
MCHC: 28 g/dL — ABNORMAL LOW (ref 30.0–36.0)
MCV: 75.8 fL — ABNORMAL LOW (ref 80.0–100.0)
Platelets: 421 10*3/uL — ABNORMAL HIGH (ref 150–400)
RBC: 5.24 MIL/uL — ABNORMAL HIGH (ref 3.87–5.11)
RDW: 20.9 % — ABNORMAL HIGH (ref 11.5–15.5)
WBC: 13.2 10*3/uL — ABNORMAL HIGH (ref 4.0–10.5)
nRBC: 0 % (ref 0.0–0.2)

## 2022-03-22 LAB — BASIC METABOLIC PANEL
Anion gap: 11 (ref 5–15)
BUN: 9 mg/dL (ref 6–20)
CO2: 23 mmol/L (ref 22–32)
Calcium: 9.3 mg/dL (ref 8.9–10.3)
Chloride: 102 mmol/L (ref 98–111)
Creatinine, Ser: 0.75 mg/dL (ref 0.44–1.00)
GFR, Estimated: >60 mL/min (ref >60)
Glucose, Bld: 93 mg/dL (ref 70–99)
Potassium: 4 mmol/L (ref 3.5–5.1)
Sodium: 136 mmol/L (ref 135–145)

## 2022-03-22 LAB — TROPONIN I (HIGH SENSITIVITY): Troponin I (High Sensitivity): 5 ng/L (ref <18)

## 2022-03-22 NOTE — ED Triage Notes (Signed)
Left sided chest pain x 1 day radiates to back inbetween shoulders. Denies any SOB, cough or n/v/d. No meds PTA.

## 2022-03-22 NOTE — ED Provider Triage Note (Signed)
Emergency Medicine Provider Triage Evaluation Note  Becky Chen , a 41 y.o. female  was evaluated in triage.  Pt complains of left-sided chest pain radiates to the left shoulder, symptoms started yesterday.  Review of Systems  Positive:  Negative:   Physical Exam  BP (!) 144/105 (BP Location: Right Arm)   Pulse 100   Temp 98.1 F (36.7 C) (Oral)   Resp 18   Ht 5\' 1"  (1.549 m)   Wt 131.5 kg   LMP 03/22/2022 (Exact Date)   SpO2 97%   BMI 54.80 kg/m  Gen:   Awake, no distress   Resp:  Normal effort  MSK:   Moves extremities without difficulty  Other:    Medical Decision Making  Medically screening exam initiated at 6:56 PM.  Appropriate orders placed.  Becky Chen was informed that the remainder of the evaluation will be completed by another provider, this initial triage assessment does not replace that evaluation, and the importance of remaining in the ED until their evaluation is complete.  Patient is non-smoker, denies hormone therapy or birth control use   Becky Starks, PA-C 03/22/22 1856

## 2022-03-22 NOTE — ED Notes (Signed)
No answer when called several times from lobby 

## 2022-03-23 NOTE — ED Notes (Signed)
No answer when called several times from lobby 

## 2022-04-06 ENCOUNTER — Encounter (INDEPENDENT_AMBULATORY_CARE_PROVIDER_SITE_OTHER): Payer: Medicaid Other | Admitting: Vascular Surgery

## 2022-05-26 ENCOUNTER — Telehealth (INDEPENDENT_AMBULATORY_CARE_PROVIDER_SITE_OTHER): Payer: Self-pay

## 2022-05-26 NOTE — Telephone Encounter (Signed)
Called pt to confirm address. An appt reminder was mailed out and came back as undeliverable. Please get correct address.

## 2022-06-15 ENCOUNTER — Encounter (INDEPENDENT_AMBULATORY_CARE_PROVIDER_SITE_OTHER): Payer: Medicaid Other | Admitting: Vascular Surgery

## 2022-12-08 ENCOUNTER — Other Ambulatory Visit: Payer: Self-pay | Admitting: Family Medicine

## 2022-12-08 DIAGNOSIS — Z1231 Encounter for screening mammogram for malignant neoplasm of breast: Secondary | ICD-10-CM

## 2023-02-24 ENCOUNTER — Telehealth: Payer: Self-pay

## 2023-02-24 NOTE — Telephone Encounter (Signed)
The patient is scheduled to see Debbe Odea, MD on 03-02-2023 as a new patient. Called and left a voice message to inquire about any previous cardiac history. Requested the patient to call back at their earliest convenience.

## 2023-03-02 ENCOUNTER — Ambulatory Visit: Payer: Medicaid Other | Admitting: Cardiology

## 2023-05-09 ENCOUNTER — Ambulatory Visit: Payer: Medicaid Other | Admitting: Cardiology

## 2023-05-10 ENCOUNTER — Telehealth: Payer: Self-pay | Admitting: *Deleted

## 2023-05-10 NOTE — Telephone Encounter (Signed)
 Lmovm to verify card hx.

## 2023-05-12 ENCOUNTER — Ambulatory Visit: Payer: Medicaid Other | Admitting: Cardiology
